# Patient Record
Sex: Female | Born: 1955 | Race: White | Hispanic: Yes | Marital: Married | Smoking: Never smoker
Health system: Southern US, Community
[De-identification: ages and names within clinical notes are randomized; demographics above are authoritative.]

## PROBLEM LIST (undated history)

## (undated) DIAGNOSIS — R059 Cough, unspecified: Secondary | ICD-10-CM

## (undated) DIAGNOSIS — M109 Gout, unspecified: Secondary | ICD-10-CM

## (undated) DIAGNOSIS — D219 Benign neoplasm of connective and other soft tissue, unspecified: Secondary | ICD-10-CM

## (undated) DIAGNOSIS — E119 Type 2 diabetes mellitus without complications: Secondary | ICD-10-CM

## (undated) DIAGNOSIS — G43909 Migraine, unspecified, not intractable, without status migrainosus: Secondary | ICD-10-CM

## (undated) DIAGNOSIS — I10 Essential (primary) hypertension: Secondary | ICD-10-CM

## (undated) DIAGNOSIS — F419 Anxiety disorder, unspecified: Secondary | ICD-10-CM

## (undated) DIAGNOSIS — R05 Cough: Secondary | ICD-10-CM

## (undated) HISTORY — DX: Migraine, unspecified, not intractable, without status migrainosus: G43.909

## (undated) HISTORY — DX: Benign neoplasm of connective and other soft tissue, unspecified: D21.9

## (undated) HISTORY — DX: Cough: R05

## (undated) HISTORY — DX: Gout, unspecified: M10.9

## (undated) HISTORY — DX: Anxiety disorder, unspecified: F41.9

## (undated) HISTORY — DX: Essential (primary) hypertension: I10

## (undated) HISTORY — DX: Cough, unspecified: R05.9

## (undated) HISTORY — PX: ROBOTIC ASSISTED TOTAL HYSTERECTOMY WITH BILATERAL SALPINGO OOPHERECTOMY: SHX6086

---

## 2013-09-27 ENCOUNTER — Emergency Department (HOSPITAL_COMMUNITY): Payer: Medicaid Other

## 2013-09-27 ENCOUNTER — Encounter (HOSPITAL_COMMUNITY): Payer: Self-pay | Admitting: Emergency Medicine

## 2013-09-27 ENCOUNTER — Emergency Department (HOSPITAL_COMMUNITY)
Admission: EM | Admit: 2013-09-27 | Discharge: 2013-09-27 | Disposition: A | Discharge status: Home / Self Care | Payer: Medicaid Other | Attending: Emergency Medicine | Admitting: Emergency Medicine

## 2013-09-27 DIAGNOSIS — J069 Acute upper respiratory infection, unspecified: Secondary | ICD-10-CM | POA: Insufficient documentation

## 2013-09-27 DIAGNOSIS — Z8701 Personal history of pneumonia (recurrent): Secondary | ICD-10-CM | POA: Insufficient documentation

## 2013-09-27 DIAGNOSIS — E119 Type 2 diabetes mellitus without complications: Secondary | ICD-10-CM | POA: Insufficient documentation

## 2013-09-27 DIAGNOSIS — Z79899 Other long term (current) drug therapy: Secondary | ICD-10-CM | POA: Insufficient documentation

## 2013-09-27 HISTORY — DX: Type 2 diabetes mellitus without complications: E11.9

## 2013-09-27 MED ORDER — AMLODIPINE BESYLATE 5 MG PO TABS
5.0000 mg | ORAL_TABLET | Freq: Every day | ORAL | Status: DC
  Administered 2013-09-27: 5 mg via ORAL
  Filled 2013-09-27: qty 1

## 2013-09-27 MED ORDER — DEXTROMETHORPHAN-GUAIFENESIN 10-100 MG/5ML PO LIQD: 10.0000 mL | ORAL | Status: DC | PRN

## 2013-09-27 NOTE — Discharge Instructions (Signed)
Call for a follow up appointment with a Family or Primary Care Provider.  Return if Symptoms worsen.   Take medication as prescribed.  [place] gargles 3-4 times a day.

## 2013-09-27 NOTE — ED Provider Notes (Signed)
Medical screening examination/treatment/procedure(s) were performed by non-physician practitioner and as supervising physician I was immediately available for consultation/collaboration.  Richarda Blade, MD 09/27/13 [phone]

## 2013-09-27 NOTE — ED Notes (Signed)
Pt has had cough for 2 weeks with on/off fever.  Pt went to primary md and was placed on antibiotics.  Pt states symptoms have not improved.  Cough in non-productive.

## 2013-09-27 NOTE — ED Notes (Signed)
Patient transported to X-ray 

## 2013-09-27 NOTE — ED Provider Notes (Signed)
CSN: [id number]     Arrival date & time 09/27/13  0908 History   First MD Initiated Contact with Patient 09/27/13 [phone]     Chief Complaint  Patient presents with  . Cough  . Fever     (Consider location/radiation/quality/duration/timing/severity/associated sxs/prior Treatment) HPI Comments: The patient is a 58 year old female past medical history of diabetes present emergency room chief complaint of persistent cough for 2 weeks. The patient reports being evaluated by medical clinic approximately 2 weeks ago and diagnosed with a viral illness and prescribed Augmentin.  The patient reports starting the antibiotic therapy on 6/4 and was compliant. She was also diagnosed with a urinary tract infection and was prescribed Cipro and reports compliance with that medication. She reports fever approximately one week ago. The patient reports a persistent cough and mild rhinorrhea, itching throat.  Denies recent travel in the [place] or out of the [place], no history of HIV or immunocompromised state. The patient reports a history of hypertension and did not take her Norvasc today.   NO PCO  The history is provided by the patient. No language interpreter was used.    Past Medical History  Diagnosis Date  . Diabetes mellitus without complication    Past Surgical History  Procedure Laterality Date  . Cesarean section     History reviewed. No pertinent family history. History  Substance Use Topics  . Smoking status: Never Smoker   . Smokeless tobacco: Not on file  . Alcohol Use: No   OB History   Grav Para Term Preterm Abortions TAB SAB Ect Mult Living                 Review of Systems  Constitutional: Positive for fever. Negative for chills.  Respiratory: Positive for cough. Negative for shortness of breath and wheezing.   Cardiovascular: Negative for chest pain.  Gastrointestinal: Negative for nausea, vomiting and abdominal pain.  Genitourinary: Negative for dysuria.      Allergies   Review of patient's allergies indicates no known allergies.  Home Medications   Prior to Admission medications   Medication Sig Start Date End Date Taking? Authorizing Fia Hebert  amLODipine (NORVASC) 5 MG tablet Take 5 mg by mouth daily.   Yes Historical Freddye Cardamone, MD  metFORMIN (GLUCOPHAGE) 500 MG tablet Take 500 mg by mouth 2 (two) times daily with a meal.   Yes Historical Coren Sagan, MD  promethazine-phenylephrine (PROMETHAZINE VC) 6.25-5 MG/5ML SYRP Take 5 mLs by mouth every 4 (four) hours as needed for congestion.   Yes Historical Porshe Fleagle, MD   BP 149/91  Pulse 118  Temp(Src) 98.3 F (36.8 C) (Oral)  Resp 16  SpO2 95% Physical Exam  Nursing note and vitals reviewed. Constitutional: She appears well-developed and well-nourished.  Non-toxic appearance. She does not have a sickly appearance. She does not appear ill. No distress.  HENT:  Head: Normocephalic and atraumatic.  Right Ear: Tympanic membrane and external ear normal. No middle ear effusion.  Left Ear: Tympanic membrane and external ear normal.  No middle ear effusion.  Nose: Rhinorrhea present. Right sinus exhibits no maxillary sinus tenderness and no frontal sinus tenderness. Left sinus exhibits no maxillary sinus tenderness and no frontal sinus tenderness.  Mouth/Throat: Uvula is midline and mucous membranes are normal. No oral lesions. No trismus in the jaw. Posterior oropharyngeal erythema present. No oropharyngeal exudate, posterior oropharyngeal edema or tonsillar abscesses.  Eyes: EOM are normal. Pupils are equal, round, and reactive to light. Right eye exhibits no discharge. Left  eye exhibits no discharge.  Neck: Normal range of motion. Neck supple.  Cardiovascular: Normal rate and regular rhythm.   No murmur heard. Pulmonary/Chest: Effort normal and breath sounds normal. She has no wheezes. She has no rales.  Abdominal: Soft.  Lymphadenopathy:    She has no cervical adenopathy.  Skin: Skin is warm and dry. No rash  noted. She is not diaphoretic.  Psychiatric: She has a normal mood and affect. Her behavior is normal. Thought content normal.    ED Course  Procedures (including critical care time) Labs Review Labs Reviewed - No data to display  Imaging Review Dg Chest 2 View  09/27/2013   CLINICAL DATA:  Cough, congestion  EXAM: CHEST  2 VIEW  COMPARISON:  None.  FINDINGS: Cardiomediastinal silhouette is unremarkable. No pulmonary edema. There is bilateral streaky atelectasis or early infiltrate. Mild degenerative changes thoracic spine.  IMPRESSION: No pulmonary edema. Bilateral basilar streaky atelectasis or early infiltrate. Mild degenerative changes thoracic spine.   Electronically Signed   By: Lahoma Crocker M.D.   On: 09/27/2013 09:48     EKG Interpretation None      MDM   Final diagnoses:  URI (upper respiratory infection)  History of pneumonia   Patient presents with persistent cough, treated for 7 days with Augmentin and Cipro, reports compliance x-ray shows bilateral basilar streaky atelectasis or early infiltrate. Lungs clear to auscultation, afebrile. X-ray likely persistent changes do to recent pneumonia. The patient is tachycardic at approximately 118, noncompliant and Norvasc this morning while given a dose of Norvasc here and reevaluate. Discharge pulse 73. Discussed lab results, imaging results, and treatment plan with the patient. Advised patient to followup with her PCP for a repeat x-ray in several weeks. Will treat symptomatically for a likely viral illness. Return precautions given. Reports understanding and no other concerns at this time.  Patient is stable for discharge at this time.  Meds given in ED:  Medications - No data to display  Discharge Medication List as of 09/27/2013 11:12 AM    START taking these medications   Details  dextromethorphan-guaiFENesin (ROBITUSSIN-DM) 10-100 MG/5ML liquid Take 10 mLs by mouth every 4 (four) hours as needed for cough., Starting  09/27/2013, Until Discontinued, Print           Lorrine Kin, PA-C 09/27/13 1558

## 2013-10-18 ENCOUNTER — Ambulatory Visit (INDEPENDENT_AMBULATORY_CARE_PROVIDER_SITE_OTHER): Payer: Medicaid Other | Admitting: Pulmonary Disease

## 2013-10-18 ENCOUNTER — Encounter: Payer: Self-pay | Admitting: Pulmonary Disease

## 2013-10-18 ENCOUNTER — Ambulatory Visit (INDEPENDENT_AMBULATORY_CARE_PROVIDER_SITE_OTHER)
Admission: RE | Admit: 2013-10-18 | Discharge: 2013-10-18 | Disposition: A | Discharge status: Home / Self Care | Payer: Medicaid Other | Source: Ambulatory Visit | Attending: Pulmonary Disease | Admitting: Pulmonary Disease

## 2013-10-18 VITALS — BP 130/80 | HR 199 | Temp 98.2°F | Ht 64.0 in | Wt 174.6 lb

## 2013-10-18 DIAGNOSIS — R05 Cough: Secondary | ICD-10-CM

## 2013-10-18 DIAGNOSIS — R059 Cough, unspecified: Secondary | ICD-10-CM

## 2013-10-18 DIAGNOSIS — R053 Chronic cough: Secondary | ICD-10-CM

## 2013-10-18 MED ORDER — PREDNISONE 10 MG PO TABS: ORAL_TABLET | ORAL | Status: DC

## 2013-10-18 MED ORDER — TRAMADOL HCL 50 MG PO TABS: 50.0000 mg | ORAL_TABLET | Freq: Four times a day (QID) | ORAL | Status: DC | PRN

## 2013-10-18 NOTE — Progress Notes (Signed)
   Subjective:    Patient ID: Laura Richardson, female    DOB: 03-27-1956, 58 y.o.   MRN: [id number]  HPI The pt is a 58 y/o hispanic female who I have been asked to see for chronic cough.  She speaks very little/, and therefore history is obtained through an interpreter. She was in her usual state of health with no pulmonary issues and told approximately 4 weeks ago. She developed a fever, chest congestion, as well as a cough with white mucus. She also had some nasal congestion and was producing purulent mucus from her nose. She was treated with Augmentin, as well as Cipro for a possible urinary tract infection, and a chest x-ray at that time showed basilar atelectasis versus early infiltrate. The patient had definite improvement in her cough, but it has not resolved. It is now dry and hacking in nature, and clearly has a cyclical component. It is definitely better when she takes her cough syrup at night. She has not had any significant change in her breathing during the entire episode. She denies any postnasal drip or reflux symptoms, and does not clear her throat excessively. She does feel an "itch" in her throat/upper airway which only resolved with a cough. She denies any further fevers or chills. She has not had a followup chest x-ray.   Review of Systems  Constitutional: Negative for fever and unexpected weight change.  HENT: Negative for congestion, dental problem, ear pain, nosebleeds, postnasal drip, rhinorrhea, sinus pressure, sneezing, sore throat and trouble swallowing.   Eyes: Negative for redness and itching.  Respiratory: Positive for cough and shortness of breath. Negative for chest tightness and wheezing.   Cardiovascular: Negative for palpitations and leg swelling.  Gastrointestinal: Negative for nausea and vomiting.  Genitourinary: Negative for dysuria.  Musculoskeletal: Negative for joint swelling.  Skin: Negative for rash.  Neurological: Negative for headaches.    Hematological: Does not bruise/bleed easily.  Psychiatric/Behavioral: Negative for dysphoric mood. The patient is not nervous/anxious.        Objective:   Physical Exam Constitutional:  Well developed, no acute distress  HENT:  Nares patent without discharge  Oropharynx without exudate, palate and uvula are normal  Eyes:  Perrla, eomi, no scleral icterus  Neck:  No JVD, no TMG  Cardiovascular:  Normal rate, regular rhythm, no rubs or gallops.  No murmurs        Intact distal pulses  Pulmonary :  Normal breath sounds, no stridor or respiratory distress   No rales, rhonchi, or wheezing  Abdominal:  Soft, nondistended, bowel sounds present.  No tenderness noted.   Musculoskeletal:  No lower extremity edema noted.  Lymph Nodes:  No cervical lymphadenopathy noted  Skin:  No cyanosis noted  Neurologic:  Alert, appropriate, moves all 4 extremities without obvious deficit.         Assessment & Plan:

## 2013-10-18 NOTE — Patient Instructions (Signed)
Will check chest xray today, and call you with results. Use voice as little as possible, and no throat clearing. Try hard candy (no mint or cough drops) during the day to help limit cough Prednisone over 8 days to treat inflammation in your breathing passages. Can use tramadol 50mg  one every 6 hrs if needed for cough.  Can make you sleepy. Can take your cough syrup at night if needed for cough.  Would like to try this for 2-3 weeks and see if helps.  followup with me again in 3 weeks.

## 2013-10-18 NOTE — Assessment & Plan Note (Signed)
The patient has a persistent cough after what sounds like an episode of severe bronchitis or possibly early pneumonia by chest x-ray. She had no history of prior lung issues. She has been treated with antibiotics with significant improvement in her cough, but is left with a residual that has a cyclical component to it. At this point, I would like to work on cough suppression, as well as behavioral therapies to limit irritation to the back of the throat. I will also treat her with a short course of prednisone to help with airway inflammation in the event of post infectious bronchiolitis. Will also check a chest x-ray today for followup.

## 2013-11-09 ENCOUNTER — Encounter: Payer: Self-pay | Admitting: Pulmonary Disease

## 2013-11-09 ENCOUNTER — Encounter (INDEPENDENT_AMBULATORY_CARE_PROVIDER_SITE_OTHER): Payer: Self-pay

## 2013-11-09 ENCOUNTER — Ambulatory Visit (INDEPENDENT_AMBULATORY_CARE_PROVIDER_SITE_OTHER): Payer: Medicaid Other | Admitting: Pulmonary Disease

## 2013-11-09 VITALS — BP 110/78 | HR 108 | Temp 98.1°F | Ht 64.0 in | Wt 167.6 lb

## 2013-11-09 DIAGNOSIS — R059 Cough, unspecified: Secondary | ICD-10-CM

## 2013-11-09 DIAGNOSIS — R053 Chronic cough: Secondary | ICD-10-CM

## 2013-11-09 DIAGNOSIS — R05 Cough: Secondary | ICD-10-CM

## 2013-11-09 NOTE — Assessment & Plan Note (Signed)
The patient's cough is 40% improved from the last visit with behavioral therapies and as needed tramadol. She is still feeling a globus sensation, and is complaining of postnasal drip today. I would like to add chlorpheniramine at bedtime, and get her to continue with behavioral therapies and as needed tramadol. I am hoping this is going to resolve on its own over time. Her spirometry today was totally normal, and her interstitial changes on x-ray are minimal with no crackles on exam. I really do not think her cough is from a lung issue.

## 2013-11-09 NOTE — Progress Notes (Signed)
   Subjective:    Patient ID: Laura Richardson, female    DOB: October 17, 1955, 58 y.o.   MRN: [id number]  HPI The patient comes in today for followup of her chronic cough that is felt to be upper airway in origin. She was treated with aggressive cough suppression, as well as behavioral therapies to minimize irritation to the upper airway. The patient has seen a 40% improvement in her cough from the last visit, and I have told her this is a good response in that short period of time. Her cough is now more intermittent rather than consistent, and she is now having a feeling of tickle in her throat along with postnasal drip. Her chest x-ray showed no acute process, but did show minimal interstitial change in the bases but without crackles on exam. She was not able to do spirometry at the last visit because of her cyclical coughing.   Review of Systems  Constitutional: Negative for fever and unexpected weight change.  HENT: Negative for congestion, dental problem, ear pain, nosebleeds, postnasal drip, rhinorrhea, sinus pressure, sneezing, sore throat and trouble swallowing.   Eyes: Negative for redness and itching.  Respiratory: Positive for cough. Negative for chest tightness, shortness of breath and wheezing.   Cardiovascular: Negative for palpitations and leg swelling.  Gastrointestinal: Negative for nausea and vomiting.  Genitourinary: Negative for dysuria.  Musculoskeletal: Negative for joint swelling.  Skin: Negative for rash.  Neurological: Negative for headaches.  Hematological: Does not bruise/bleed easily.  Psychiatric/Behavioral: Negative for dysphoric mood. The patient is not nervous/anxious.        Objective:   Physical Exam Well-developed female in no acute distress Nose without purulence or discharge noted Neck without lymphadenopathy or thyromegaly Chest totally clear to auscultation, no wheezing Cardiac exam with regular rate and rhythm Lower extremities without edema, no  cyanosis Alert and oriented, moves all 4 extremities.       Assessment & Plan:

## 2013-11-09 NOTE — Patient Instructions (Signed)
Continue on behavioral therapies such as no throat clearing, hard candy to prevent tickle, and tramadol as needed. Take chlorpheniramine 4mg  OTC, 2 at bedtime each night for next 2 weeks to help with postnasal drip/tickle in throat. Would like to give this more time to improve.  Please call me in 4 weeks to give update on how things are going.  Remember to rate your cough on scale of 0-10 with 10 being the worst it has been and 0 being no cough.

## 2013-12-09 ENCOUNTER — Telehealth: Payer: Self-pay | Admitting: Pulmonary Disease

## 2013-12-09 NOTE — Telephone Encounter (Signed)
lmtcb x1 

## 2013-12-13 NOTE — Telephone Encounter (Signed)
Last time she was here, she told me the cough was a 6/10 with 10 being the worst it has ever been and 0 being no cough.  Where does she think she is now.

## 2013-12-13 NOTE — Telephone Encounter (Signed)
Called and spoke to Mr. [place]. Pt states her cough has improved but has not completely resolved and is c/o sore throat. Pt is requesting for any recs that Dr. Gwenette Greet may have. Pt denies any SOB, CP, PND, swelling, f/c/s. Pt is not taking tramadol any more. Pt last seen on 11/09/2013 by [facility].   [place] please advise.   No Known Allergies

## 2013-12-14 NOTE — Telephone Encounter (Signed)
Would like to try something conservative such as chloroseptic lozenges to help with sore throat.  If cough is down to a 3, would like to give this a little more time to totally resolve.  Would like to see pt back in 2-3 weeks to re-evaluate cough.

## 2013-12-14 NOTE — Telephone Encounter (Signed)
Per patient husband cough is a 3/10--not completely resolved Husband states that his main concern is that her throat is sore--cough seems to be improving  Denies use of OTC meds for cough. Would like any rec's to help with sore throat and what little cough is left.  Please advise thanks.

## 2013-12-14 NOTE — Telephone Encounter (Signed)
Called spoke with Kizzie Furnish and discussed KC's recommendations as stated below.  Kizzie Furnish verbalized his understanding and denied any further questions/concerns.  Appt scheduled with Henrietta on 10.5.15 @ 0945 to re-evaluate pt's cough.  Nothing further needed at this time; will sign off.

## 2014-01-09 ENCOUNTER — Ambulatory Visit: Payer: Medicaid Other | Admitting: Pulmonary Disease

## 2015-05-24 IMAGING — CR DG CHEST 2V
2 series · 2 of 2 positions shown · non-contrast
Comparison: September 27, 2013.

CLINICAL DATA: Cough.

EXAM:
CHEST  2 VIEW

[view not recorded (1 of 2)]
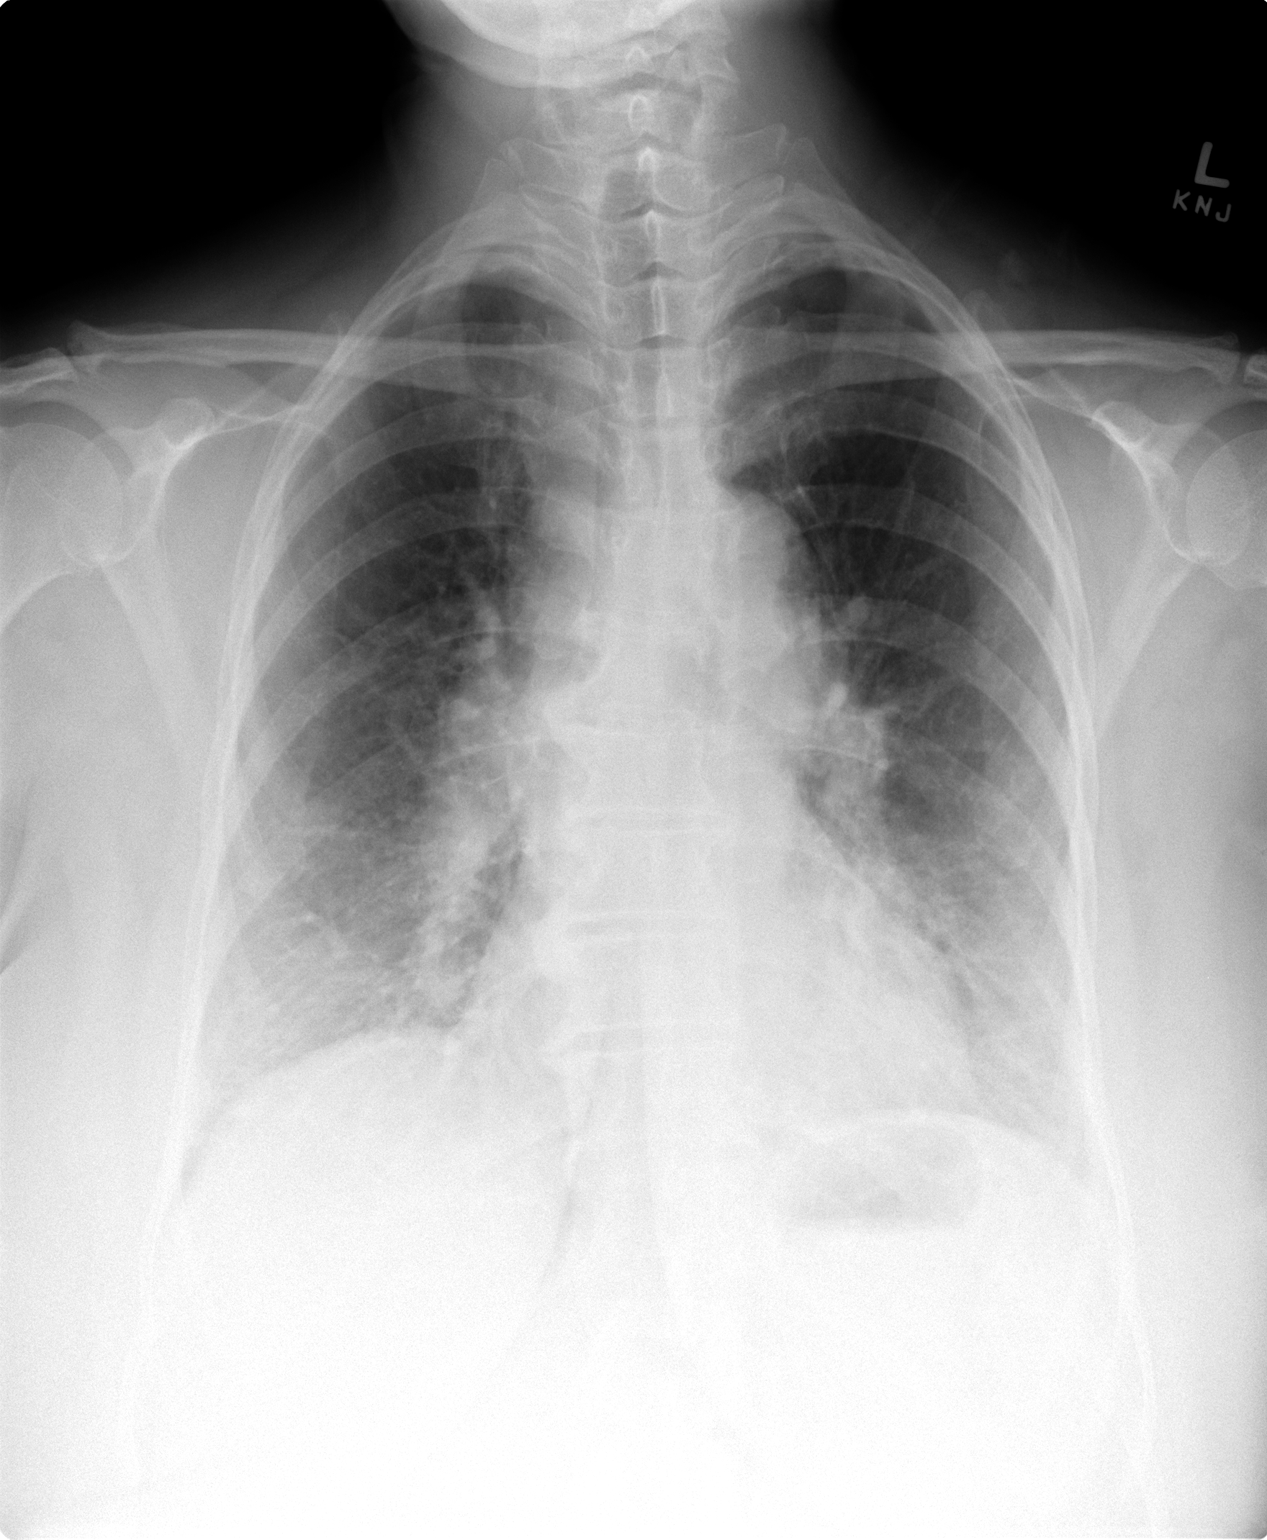

[view not recorded (2 of 2)]
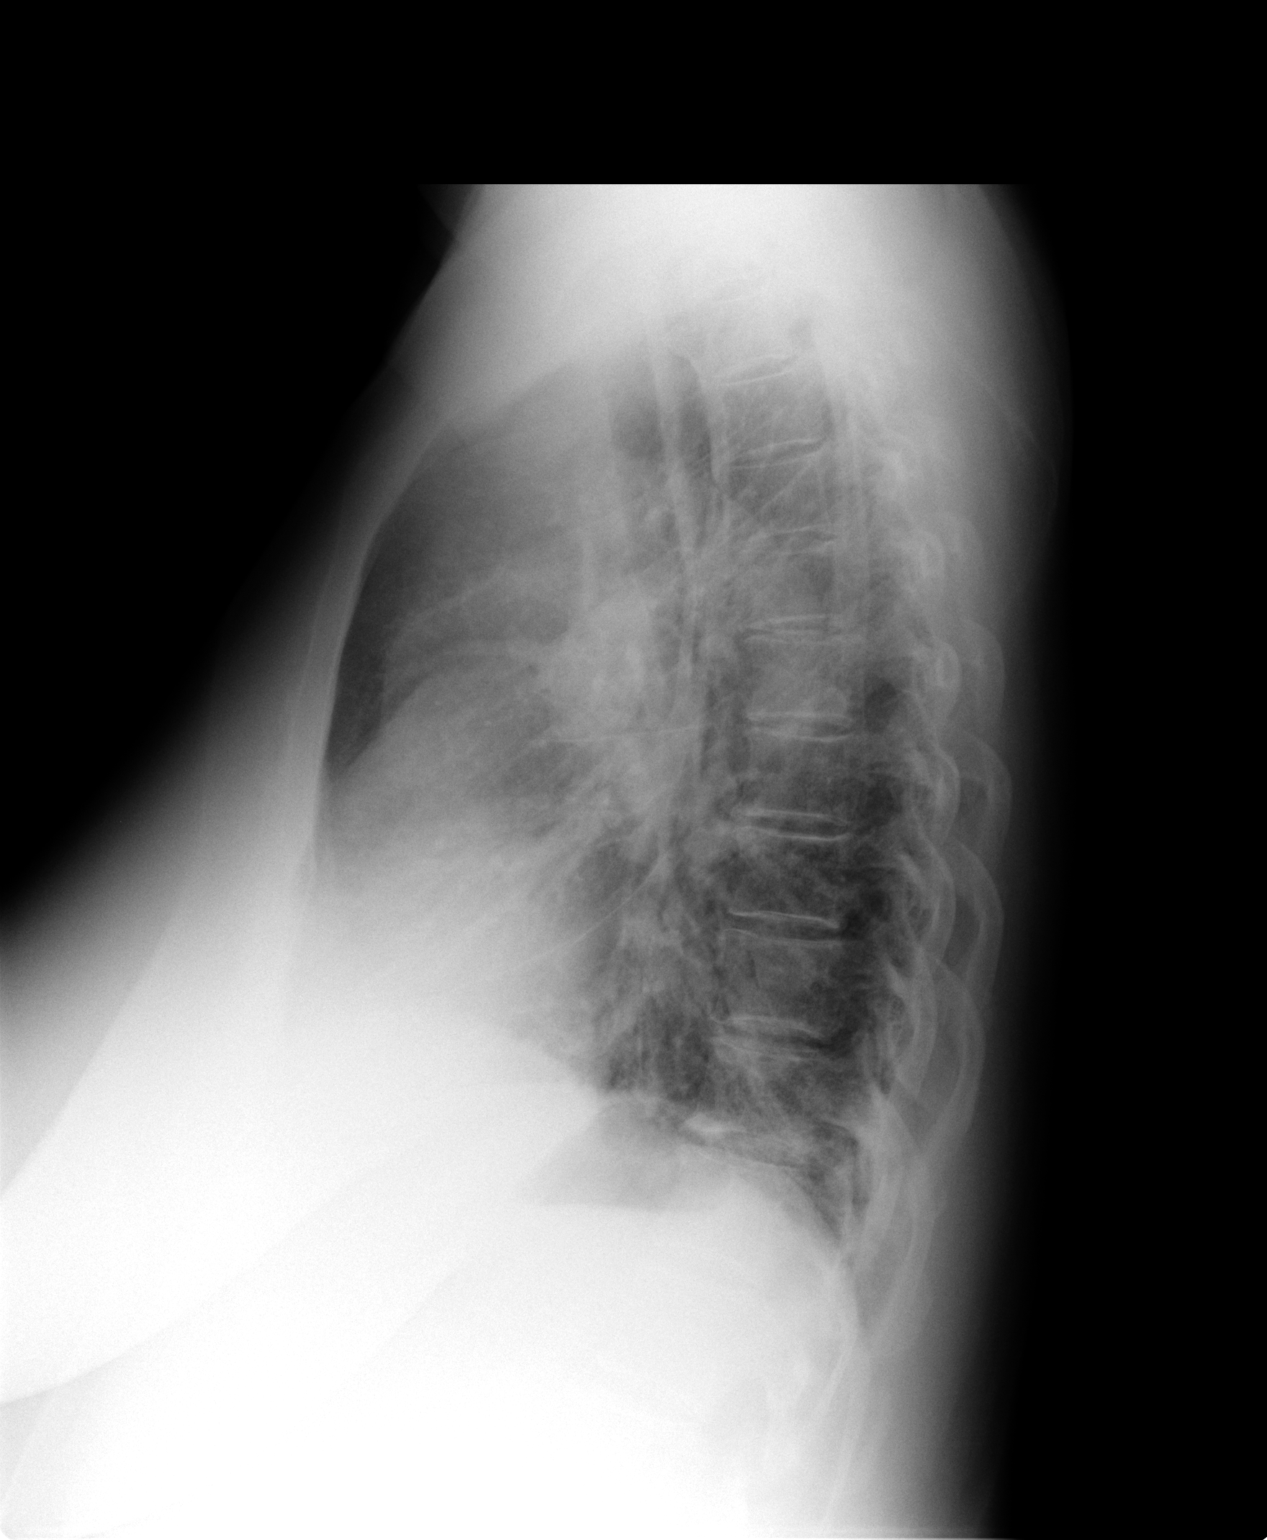

[2 of 2 positions shown; findings below may reference images not displayed]

FINDINGS: Stable cardiomediastinal silhouette. Stable mild bilateral basilar
interstitial densities are noted which may represent scarring, but
superimposed edema or inflammation cannot be excluded. No
pneumothorax or pleural effusion is noted. Osteophyte formation is
seen involving the lower thoracic spine.
IMPRESSION: Stable mild bilateral basilar interstitial densities which most
likely represent scarring, but superimposed edema or inflammation
cannot be excluded.

## 2015-06-07 ENCOUNTER — Encounter (HOSPITAL_COMMUNITY): Payer: Self-pay | Admitting: Emergency Medicine

## 2015-06-07 ENCOUNTER — Emergency Department (HOSPITAL_COMMUNITY): Payer: Medicaid Other

## 2015-06-07 ENCOUNTER — Emergency Department (HOSPITAL_COMMUNITY)
Admission: EM | Admit: 2015-06-07 | Discharge: 2015-06-07 | Disposition: A | Discharge status: Home / Self Care | Payer: Medicaid Other | Attending: Emergency Medicine | Admitting: Emergency Medicine

## 2015-06-07 DIAGNOSIS — M25512 Pain in left shoulder: Secondary | ICD-10-CM | POA: Insufficient documentation

## 2015-06-07 DIAGNOSIS — Z7984 Long term (current) use of oral hypoglycemic drugs: Secondary | ICD-10-CM | POA: Insufficient documentation

## 2015-06-07 DIAGNOSIS — M549 Dorsalgia, unspecified: Secondary | ICD-10-CM | POA: Insufficient documentation

## 2015-06-07 DIAGNOSIS — M25511 Pain in right shoulder: Secondary | ICD-10-CM | POA: Insufficient documentation

## 2015-06-07 DIAGNOSIS — I10 Essential (primary) hypertension: Secondary | ICD-10-CM | POA: Diagnosis not present

## 2015-06-07 DIAGNOSIS — Z79899 Other long term (current) drug therapy: Secondary | ICD-10-CM | POA: Diagnosis not present

## 2015-06-07 DIAGNOSIS — E119 Type 2 diabetes mellitus without complications: Secondary | ICD-10-CM | POA: Diagnosis not present

## 2015-06-07 DIAGNOSIS — R0789 Other chest pain: Secondary | ICD-10-CM | POA: Diagnosis not present

## 2015-06-07 DIAGNOSIS — R079 Chest pain, unspecified: Secondary | ICD-10-CM | POA: Diagnosis present

## 2015-06-07 DIAGNOSIS — M542 Cervicalgia: Secondary | ICD-10-CM | POA: Insufficient documentation

## 2015-06-07 DIAGNOSIS — Z791 Long term (current) use of non-steroidal anti-inflammatories (NSAID): Secondary | ICD-10-CM | POA: Diagnosis not present

## 2015-06-07 LAB — CBC
HCT: 49 % — ABNORMAL HIGH (ref 36.0–46.0)
Hemoglobin: 15.5 g/dL — ABNORMAL HIGH (ref 12.0–15.0)
MCH: 25.6 pg — ABNORMAL LOW (ref 26.0–34.0)
MCHC: 31.6 g/dL (ref 30.0–36.0)
MCV: 81 fL (ref 78.0–100.0)
Platelets: 253 K/uL (ref 150–400)
RBC: 6.05 MIL/uL — ABNORMAL HIGH (ref 3.87–5.11)
RDW: 14.9 % (ref 11.5–15.5)
WBC: 10.1 K/uL (ref 4.0–10.5)

## 2015-06-07 LAB — BASIC METABOLIC PANEL WITH GFR
Anion gap: 10 (ref 5–15)
BUN: 18 mg/dL (ref 6–20)
CO2: 25 mmol/L (ref 22–32)
Calcium: 9.5 mg/dL (ref 8.9–10.3)
Chloride: 103 mmol/L (ref 101–111)
Creatinine, Ser: 0.69 mg/dL (ref 0.44–1.00)
GFR calc Af Amer: >60 mL/min
GFR calc non Af Amer: >60 mL/min
Glucose, Bld: 101 mg/dL — ABNORMAL HIGH (ref 65–99)
Potassium: 3.7 mmol/L (ref 3.5–5.1)
Sodium: 138 mmol/L (ref 135–145)

## 2015-06-07 LAB — I-STAT TROPONIN, ED: Troponin i, poc: 0 ng/mL (ref 0.00–0.08)

## 2015-06-07 MED ORDER — CYCLOBENZAPRINE HCL 10 MG PO TABS: 10.0000 mg | ORAL_TABLET | Freq: Two times a day (BID) | ORAL | Status: DC | PRN

## 2015-06-07 NOTE — ED Notes (Addendum)
Pt c/o chest, arm, back. No SOB. Pt reports having seen provider for the same recently, was told pain was musculoskeletal. Pain worsens with movement of arms but present at rest. Not reproducible with palpation.

## 2015-06-07 NOTE — Discharge Instructions (Signed)
Dolor en la pared torcica (Chest Wall Pain) El dolor en la pared torcica se produce en los huesos y los msculos del pecho o alrededor de Orthoptist. A veces, una lesin Arts administrator. En ocasiones, la causa puede ser desconocida. Este dolor puede durar varias semanas. INSTRUCCIONES PARA EL CUIDADO EN EL HOGAR  Est atento a cualquier cambio en los sntomas. Tome estas medidas para Theatre stage manager dolor:   Haga reposo como se lo haya indicado el [place] actividades que causan dolor. Estas pueden ser [employer] requieren el uso de los msculos del trax, los abdominales o los laterales para levantar objetos pesados.   Si se lo indican, aplique hielo sobre la zona dolorida:  Ponga el hielo en una bolsa plstica.  Coloque una toalla entre la piel y la bolsa de hielo.  Coloque el hielo durante 53minutos, 2 a 3veces por Training and development officer.  Tome los medicamentos de venta libre y los recetados solamente como se lo haya indicado el mdico.  No consuma productos que contengan tabaco, incluidos cigarrillos, tabaco de Higher education careers adviser y Psychologist, sport and exercise. Si necesita ayuda para dejar de fumar, consulte al mdico.  Concurra a todas las visitas de control como se lo haya indicado el mdico. Esto es importante. SOLICITE ATENCIN MDICA SI:  Jaclynn Guarneri.  El dolor de [place].  Aparecen nuevos sntomas. SOLICITE ATENCIN MDICA DE INMEDIATO SI:  Tiene nuseas o vmitos.  Philbert Riser o tiene sensacin de desvanecimiento.  Tiene tos con flema (esputo) o expectora sangre al toser.  Le falta el aire.   Esta informacin no tiene Marine scientist el consejo del mdico. Asegrese de hacerle al mdico cualquier pregunta que tenga.  Follow up with primary care provider for re-evaluation of symptoms. Apply ice to affected area. Encourage stretching of muscles. Take Flexeril and needed for muscle spasms. Return to the ED if you experience severe worsening of your symptoms, dizziness, weakness,  shortness of breath, fever, blurry vision, numbness/tingling in your extremities.

## 2015-06-08 NOTE — ED Provider Notes (Signed)
CSN: [id number]     Arrival date & time 06/07/15  1006 History   First MD Initiated Contact with Patient 06/07/15 1206     Chief Complaint  Patient presents with  . Chest Pain     (Consider location/radiation/quality/duration/timing/severity/associated sxs/prior Treatment) HPI   Laura Richardson is a 60 y.o F with a pmhx of HTN, DM who presents to the ED today c/o chest pain. Pt states that 1 week pt has been experiencing intermittent pain across her chest, bilateral shoulders, neck and back. Pain is described as crampy, and "feels like a sore muscle". No known trauma or injury. Pain is not reproducible with palpation. No aggravating or alleviating factors. Pain comes and goes several times a day. No associated diaphoresis, dizziness, paresthesias, shortness of breath. Patient was seen by primary care doctor earlier this week for same symptoms and was told pain was musculoskeletal. She was given baclofen and tramadol for her symptoms. Patient has not been taking them as frequently because she states that they make her sick to her stomach.  Past Medical History  Diagnosis Date  . Diabetes mellitus without complication ([place])   . Cough   . Gout   . HTN (hypertension)    Past Surgical History  Procedure Laterality Date  . Cesarean section     History reviewed. No pertinent family history. Social History  Substance Use Topics  . Smoking status: Never Smoker   . Smokeless tobacco: None  . Alcohol Use: No   OB History    No data available     Review of Systems  All other systems reviewed and are negative.     Allergies  Review of patient's allergies indicates no known allergies.  Home Medications   Prior to Admission medications   Medication Sig Start Date End Date Taking? Authorizing Provider  amLODipine (NORVASC) 5 MG tablet Take 5 mg by mouth daily.   Yes Historical Provider, MD  diclofenac (VOLTAREN) 75 MG EC tablet Take 75 mg by mouth 2 (two) times daily. 06/01/15  Yes  Historical Provider, MD  metFORMIN (GLUCOPHAGE) 500 MG tablet Take 500 mg by mouth daily after supper.   Yes Historical Provider, MD  traMADol (ULTRAM) 50 MG tablet Take 50 mg by mouth 2 (two) times daily as needed (for pain).   Yes Historical Provider, MD  cyclobenzaprine (FLEXERIL) 10 MG tablet Take 1 tablet (10 mg total) by mouth 2 (two) times daily as needed for muscle spasms. 06/07/15   Connee Ikner Tripp Kelin Borum, PA-C   BP 134/78 mmHg  Pulse 71  Temp(Src) 97.6 F (36.4 C) (Oral)  Resp 18  SpO2 97% Physical Exam  Constitutional: She is oriented to person, place, and time. She appears well-developed and well-nourished. No distress.  HENT:  Head: Normocephalic and atraumatic.  Mouth/Throat: No oropharyngeal exudate.  Eyes: Conjunctivae and EOM are normal. Pupils are equal, round, and reactive to light. Right eye exhibits no discharge. Left eye exhibits no discharge. No scleral icterus.  Neck: Neck supple.  No bruits.  Cardiovascular: Normal rate, regular rhythm, normal heart sounds and intact distal pulses.  Exam reveals no gallop and no friction rub.   No murmur heard. Radial pulses equal bilaterally.  Pulmonary/Chest: Effort normal and breath sounds normal. No respiratory distress. She has no wheezes. She has no rales. She exhibits no tenderness.  Abdominal: Soft. She exhibits no distension. There is no tenderness. There is no guarding.  Musculoskeletal: Normal range of motion. She exhibits no edema.  Lymphadenopathy:  She has no cervical adenopathy.  Neurological: She is alert and oriented to person, place, and time. She has normal reflexes. No cranial nerve deficit. She exhibits normal muscle tone. Coordination normal.  Strength 5/5 throughout. No sensory deficits.  No gait abnormality. Normal finger to nose.   Skin: Skin is warm and dry. No rash noted. She is not diaphoretic. No erythema. No pallor.  Psychiatric: She has a normal mood and affect. Her behavior is normal.  Nursing  note and vitals reviewed.   ED Course  Procedures (including critical care time) Labs Review Labs Reviewed  BASIC METABOLIC PANEL - Abnormal; Notable for the following:    Glucose, Bld 101 (*)    All other components within normal limits  CBC - Abnormal; Notable for the following:    RBC 6.05 (*)    Hemoglobin 15.5 (*)    HCT 49.0 (*)    MCH 25.6 (*)    All other components within normal limits  Randolm Idol, ED    Imaging Review Dg Chest 2 View  06/07/2015  CLINICAL DATA:  Chest pain. EXAM: CHEST  2 VIEW COMPARISON:  October 18, 2013. FINDINGS: The heart size and mediastinal contours are within normal limits. No pneumothorax or pleural effusion is noted. Mild bibasilar subsegmental atelectasis or scarring is noted. The visualized skeletal structures are unremarkable. IMPRESSION: Mild bibasilar subsegmental atelectasis or scarring. Electronically Signed   By: Marijo Conception, M.D.   On: 06/07/2015 13:30   I have personally reviewed and evaluated these images and lab results as part of my medical decision-making.   EKG Interpretation   Date/Time:  Thursday June 07 2015 13:44:19 EST Ventricular Rate:  89 PR Interval:  131 QRS Duration: 79 QT Interval:  394 QTC Calculation: 479 R Axis:   -41 Text Interpretation:  Sinus rhythm Left anterior fascicular block Probable  anterior infarct, old Baseline wander in lead(s) V2 V6 ED PHYSICIAN  INTERPRETATION AVAILABLE IN [facility] Confirmed by TEST, Record  ([id number]) on 06/08/2015 7:22:51 AM      MDM   Final diagnoses:  Chest wall pain    Patient is to be discharged with recommendation to follow up with PCP in regards to today's hospital visit. Chest pain is not likely of cardiac or pulmonary etiology d/t presentation, perc negative, VSS, no tracheal deviation, no JVD or new murmur, RRR, breath sounds equal bilaterally, EKG without acute abnormalities, negative troponin, and negative CXR. Pt is low risk HEART score. Low  suspicion ACS. No neurological deficits. Doubt dissection. Pain is likely due to musculoskeletal etiology. Will d/c home with flexeril. Pt instructed to return to the ED is CP becomes exertional, associated with diaphoresis or nausea, radiates to left jaw/arm, worsens or becomes concerning in any way. Pt appears reliable for follow up and is agreeable to discharge.   Case has been discussed with and seen by Dr. Wilson Singer who agrees with the above plan to discharge.      Dondra Spry [place], PA-C 06/08/15 [place], MD 06/10/15 [phone]

## 2015-12-25 ENCOUNTER — Ambulatory Visit (INDEPENDENT_AMBULATORY_CARE_PROVIDER_SITE_OTHER): Payer: No Typology Code available for payment source | Admitting: Family Medicine

## 2015-12-25 ENCOUNTER — Encounter: Payer: Self-pay | Admitting: Family Medicine

## 2015-12-25 VITALS — BP 135/75 | HR 89 | Temp 98.0°F | Resp 18 | Ht 64.0 in | Wt 176.0 lb

## 2015-12-25 DIAGNOSIS — Z114 Encounter for screening for human immunodeficiency virus [HIV]: Secondary | ICD-10-CM

## 2015-12-25 DIAGNOSIS — Z1211 Encounter for screening for malignant neoplasm of colon: Secondary | ICD-10-CM

## 2015-12-25 DIAGNOSIS — Z1159 Encounter for screening for other viral diseases: Secondary | ICD-10-CM

## 2015-12-25 DIAGNOSIS — E138 Other specified diabetes mellitus with unspecified complications: Secondary | ICD-10-CM

## 2015-12-25 LAB — LIPID PANEL
Cholesterol: 154 mg/dL (ref 125–200)
HDL: 50 mg/dL (ref >=46)
LDL CALC: 61 mg/dL (ref <130)
Total CHOL/HDL Ratio: 3.1 Ratio (ref <=5.0)
Triglycerides: 213 mg/dL — ABNORMAL HIGH (ref <150)
VLDL: 43 mg/dL — ABNORMAL HIGH (ref <30)

## 2015-12-25 LAB — CBC WITH DIFFERENTIAL/PLATELET
BASOS PCT: 0 %
Basophils Absolute: 0 cells/uL (ref 0–200)
Eosinophils Absolute: 783 cells/uL — ABNORMAL HIGH (ref 15–500)
Eosinophils Relative: 9 %
HEMATOCRIT: 45.6 % — AB (ref 35.0–45.0)
Hemoglobin: 14.8 g/dL (ref 11.7–15.5)
LYMPHS PCT: 38 %
Lymphs Abs: 3306 cells/uL (ref 850–3900)
MCH: 26.1 pg — ABNORMAL LOW (ref 27.0–33.0)
MCHC: 32.5 g/dL (ref 32.0–36.0)
MCV: 80.4 fL (ref 80.0–100.0)
MONO ABS: 348 {cells}/uL (ref 200–950)
MPV: 10.3 fL (ref 7.5–12.5)
Monocytes Relative: 4 %
Neutro Abs: 4263 cells/uL (ref 1500–7800)
Neutrophils Relative %: 49 %
Platelets: 243 10*3/uL (ref 140–400)
RBC: 5.67 MIL/uL — ABNORMAL HIGH (ref 3.80–5.10)
RDW: 14.4 % (ref 11.0–15.0)
WBC: 8.7 10*3/uL (ref 3.8–10.8)

## 2015-12-25 LAB — COMPLETE METABOLIC PANEL WITH GFR
ALT: 24 U/L (ref 6–29)
AST: 23 U/L (ref 10–35)
Albumin: 4.5 g/dL (ref 3.6–5.1)
Alkaline Phosphatase: 101 U/L (ref 33–130)
BUN: 14 mg/dL (ref 7–25)
CO2: 21 mmol/L (ref 20–31)
Calcium: 9.4 mg/dL (ref 8.6–10.4)
Chloride: 104 mmol/L (ref 98–110)
Creat: 0.65 mg/dL (ref 0.50–0.99)
GFR, Est Non African American: >89 mL/min (ref >=60)
Glucose, Bld: 107 mg/dL — ABNORMAL HIGH (ref 65–99)
Potassium: 3.9 mmol/L (ref 3.5–5.3)
Sodium: 139 mmol/L (ref 135–146)
Total Bilirubin: 0.5 mg/dL (ref 0.2–1.2)
Total Protein: 7.6 g/dL (ref 6.1–8.1)

## 2015-12-25 LAB — GLUCOSE, CAPILLARY: Glucose-Capillary: 121 mg/dL — ABNORMAL HIGH (ref 65–99)

## 2015-12-25 MED ORDER — LISINOPRIL 20 MG PO TABS: 20.0000 mg | ORAL_TABLET | Freq: Every day | ORAL | 1 refills | Status: DC

## 2015-12-25 MED ORDER — METFORMIN HCL 500 MG PO TABS: 500.0000 mg | ORAL_TABLET | Freq: Every day | ORAL | 1 refills | Status: DC

## 2015-12-25 MED ORDER — SIMVASTATIN 20 MG PO TABS: 20.0000 mg | ORAL_TABLET | Freq: Every day | ORAL | 1 refills | Status: DC

## 2015-12-25 MED ORDER — NYSTATIN-TRIAMCINOLONE 100000-0.1 UNIT/GM-% EX OINT: 1.0000 "application " | TOPICAL_OINTMENT | Freq: Two times a day (BID) | CUTANEOUS | 0 refills | Status: DC

## 2015-12-25 MED FILL — metFORMIN HCL 500 MG TABS: 500 | 30 days supply | Qty: 30 | Fill #0 | Status: TO

## 2015-12-25 MED FILL — SIMVASTATIN 20 MG TABLET: 20 | 30 days supply | Qty: 30 | Fill #0

## 2015-12-25 MED FILL — NYSTATIN-TRIAMCINOLONE OINT: 100000-0.1 | 30 days supply | Qty: 30 | Fill #0

## 2015-12-25 MED FILL — ?LISINOPRIL 20 MG TABLET: 20 | 30 days supply | Qty: 30 | Fill #0

## 2015-12-25 NOTE — Progress Notes (Signed)
Laura Richardson, is a 60 y.o. female  [id number]  [id number]  DOB - [birth year]  CC:  Chief Complaint  Patient presents with  . Follow-up       HPI: Laura Richardson is a 60 y.o. female here to establish care. She is accompanied by her daughter and an interpreter. She has a history of diabetes. She is on metformin 500 mg once a day with dinner. Her A1C today is 6.3. She is no on a statin or ACE/ARB She also has a history of gout and muscle spasms. She reports a chronic cough that comes from high in the throat, which has been present for several years. She is not aware of what may make it better or worse. She reports intermittent itching in genital area and request refill for nystatin/triamcinolone.  Health maintenance: Her PAP and mammogram are up to date. She is in need of a diabetic eye exam. She declines Tdap, flu and pneumonia vaccines. She does need colon cancer screening, screening for hep C and HIV.  No Known Allergies Past Medical History:  Diagnosis Date  . Cough   . Diabetes mellitus without complication ([place])   . Gout   . HTN (hypertension)    Current Outpatient Prescriptions on File Prior to Visit  Medication Sig Dispense Refill  . cyclobenzaprine (FLEXERIL) 10 MG tablet Take 1 tablet (10 mg total) by mouth 2 (two) times daily as needed for muscle spasms. 20 tablet 0  . diclofenac (VOLTAREN) 75 MG EC tablet Take 75 mg by mouth 2 (two) times daily.    . traMADol (ULTRAM) 50 MG tablet Take 50 mg by mouth 2 (two) times daily as needed (for pain).     No current facility-administered medications on file prior to visit.    History reviewed. No pertinent family history. Social History   Social History  . Marital status: Married    Spouse name: N/A  . Number of children: N/A  . Years of education: N/A   Occupational History  . unemployed    Social History Main Topics  . Smoking status: Never Smoker  . Smokeless tobacco: Never Used  . Alcohol use No  .  Drug use: No  . Sexual activity: Not on file   Other Topics Concern  . Not on file   Social History Narrative  . No narrative on file    Review of Systems: Constitutional: Negative Skin: Negative HENT: Negative  Eyes: Positive for occ floaters Neck: Negative Respiratory: Negative Cardiovascular: Negative Gastrointestinal: Positive for heartburn related to certain foods.  Genitourinary: Negative  Musculoskeletal: Negative   Neurological: Positive for occ headaches, not severe Hematological: Negative  Psychiatric/Behavioral: Negative  GYN: intermittent itching in genital area   Objective:   Vitals:   12/25/15 0939  BP: 135/75  Pulse: 89  Resp: 18  Temp: 98 F (36.7 C)    Physical Exam: Constitutional: Patient appears well-developed and well-nourished. No distress. HENT: Normocephalic, atraumatic, External right and left ear normal. Oropharynx is clear and moist.  Eyes: Conjunctivae and EOM are normal. PERRLA, no scleral icterus. Neck: Normal ROM. Neck supple. No lymphadenopathy, No thyromegaly. CVS: RRR, S1/S2 +, no murmurs, no gallops, no rubs Pulmonary: Effort and breath sounds normal, no stridor, rhonchi, wheezes, rales.  Abdominal: Soft. Normoactive BS,, no distension, tenderness, rebound or guarding.  Musculoskeletal: Normal range of motion. No edema and no tenderness.  Neuro: Alert.Normal muscle tone coordination. Non-focal Skin: Skin is warm and dry. No rash noted. Not diaphoretic. No erythema. No  pallor. Psychiatric: Normal mood and affect. Behavior, judgment, thought content normal.  Lab Results  Component Value Date   WBC 10.1 06/07/2015   HGB 15.5 (H) 06/07/2015   HCT 49.0 (H) 06/07/2015   MCV 81.0 06/07/2015   PLT 253 06/07/2015   Lab Results  Component Value Date   CREATININE 0.69 06/07/2015   BUN 18 06/07/2015   NA 138 06/07/2015   K 3.7 06/07/2015   CL 103 06/07/2015   CO2 25 06/07/2015    No results found for: HGBA1C Lipid Panel  No  results found for: CHOL, TRIG, HDL, CHOLHDL, VLDL, LDLCALC     Assessment and plan:   1. Diabetes mellitus of other type with complication ([place])  - Microalbumin, urine - COMPLETE METABOLIC PANEL WITH GFR - CBC with Differential - Lipid panel - Ambulatory referral to Ophthalmology  2. Special screening for malignant neoplasms, colon  - POC Hemoccult Bld/Stl (3-Cd Home Screen); Future  3. Screening for HIV (human immunodeficiency virus)  - HIV antibody (with reflex)  4. Need for hepatitis C screening test  - Hepatitis C Antibody   Return in about 6 months (around 06/23/2016) for Diabetes, A1C.  The patient was given clear instructions to go to ER or return to medical center if symptoms don't improve, worsen or new problems develop. The patient verbalized understanding.    Micheline Chapman FNP  12/25/2015, 3:12 PM

## 2015-12-25 NOTE — Patient Instructions (Signed)
Follow-up in 6 months. Try to follow low carb, heart healthy diet and exercise regularly.

## 2015-12-25 NOTE — Progress Notes (Signed)
Patient is here for FU  Patient has not taken medication today and patient has not eaten.  Patient denies pain at this time.

## 2015-12-26 LAB — HEPATITIS C ANTIBODY: HCV Ab: NEGATIVE

## 2015-12-26 LAB — MICROALBUMIN, URINE: Microalb, Ur: 3.6 mg/dL

## 2015-12-26 LAB — HIV ANTIBODY (ROUTINE TESTING W REFLEX): HIV: NONREACTIVE

## 2016-01-07 ENCOUNTER — Telehealth: Payer: Self-pay | Admitting: Family Medicine

## 2016-01-07 NOTE — Telephone Encounter (Signed)
Patient complains of dizziness and nausea with Metformin usage. She took an extended release previously without side effects. Would like to know what to do. Sharon Seller, NP advises that patient may stop Metformin and watch diet closely and recheck bloodwork in 3 months. This is agreeable to her.

## 2016-01-21 ENCOUNTER — Ambulatory Visit: Payer: No Typology Code available for payment source

## 2016-01-21 ENCOUNTER — Other Ambulatory Visit: Payer: Self-pay | Admitting: Family Medicine

## 2016-01-21 ENCOUNTER — Other Ambulatory Visit: Payer: Self-pay

## 2016-01-21 DIAGNOSIS — R030 Elevated blood-pressure reading, without diagnosis of hypertension: Secondary | ICD-10-CM

## 2016-01-21 MED ORDER — AMLODIPINE BESYLATE 5 MG PO TABS: 5.0000 mg | ORAL_TABLET | Freq: Every day | ORAL | 1 refills | Status: DC

## 2016-02-15 DIAGNOSIS — R030 Elevated blood-pressure reading, without diagnosis of hypertension: Secondary | ICD-10-CM | POA: Insufficient documentation

## 2016-04-24 ENCOUNTER — Ambulatory Visit: Payer: No Typology Code available for payment source | Admitting: Family Medicine

## 2016-04-25 ENCOUNTER — Encounter: Payer: Self-pay | Admitting: Family Medicine

## 2016-04-25 ENCOUNTER — Ambulatory Visit (INDEPENDENT_AMBULATORY_CARE_PROVIDER_SITE_OTHER): Payer: No Typology Code available for payment source | Admitting: Family Medicine

## 2016-04-25 VITALS — BP 146/89 | HR 92 | Temp 97.8°F | Resp 18 | Wt 176.0 lb

## 2016-04-25 DIAGNOSIS — R300 Dysuria: Secondary | ICD-10-CM

## 2016-04-25 DIAGNOSIS — R7303 Prediabetes: Secondary | ICD-10-CM | POA: Insufficient documentation

## 2016-04-25 DIAGNOSIS — R3915 Urgency of urination: Secondary | ICD-10-CM

## 2016-04-25 DIAGNOSIS — R350 Frequency of micturition: Secondary | ICD-10-CM

## 2016-04-25 DIAGNOSIS — N760 Acute vaginitis: Secondary | ICD-10-CM

## 2016-04-25 DIAGNOSIS — I10 Essential (primary) hypertension: Secondary | ICD-10-CM

## 2016-04-25 DIAGNOSIS — B9689 Other specified bacterial agents as the cause of diseases classified elsewhere: Secondary | ICD-10-CM

## 2016-04-25 LAB — CBC WITH DIFFERENTIAL/PLATELET
BASOS ABS: 0 {cells}/uL (ref 0–200)
Basophils Relative: 0 %
EOS ABS: 930 {cells}/uL — AB (ref 15–500)
Eosinophils Relative: 10 %
HCT: 45.3 % — ABNORMAL HIGH (ref 35.0–45.0)
Hemoglobin: 14.7 g/dL (ref 11.7–15.5)
LYMPHS PCT: 34 %
Lymphs Abs: 3162 cells/uL (ref 850–3900)
MCH: 26.1 pg — AB (ref 27.0–33.0)
MCHC: 32.5 g/dL (ref 32.0–36.0)
MCV: 80.3 fL (ref 80.0–100.0)
MONOS PCT: 6 %
MPV: 10.4 fL (ref 7.5–12.5)
Monocytes Absolute: 558 cells/uL (ref 200–950)
Neutro Abs: 4650 cells/uL (ref 1500–7800)
Neutrophils Relative %: 50 %
PLATELETS: 244 10*3/uL (ref 140–400)
RBC: 5.64 MIL/uL — ABNORMAL HIGH (ref 3.80–5.10)
RDW: 14.3 % (ref 11.0–15.0)
WBC: 9.3 10*3/uL (ref 3.8–10.8)

## 2016-04-25 LAB — COMPLETE METABOLIC PANEL WITH GFR
ALT: 23 U/L (ref 6–29)
AST: 22 U/L (ref 10–35)
Albumin: 4.5 g/dL (ref 3.6–5.1)
Alkaline Phosphatase: 108 U/L (ref 33–130)
BILIRUBIN TOTAL: 0.4 mg/dL (ref 0.2–1.2)
BUN: 14 mg/dL (ref 7–25)
CO2: 23 mmol/L (ref 20–31)
CREATININE: 0.69 mg/dL (ref 0.50–0.99)
Calcium: 10.1 mg/dL (ref 8.6–10.4)
Chloride: 104 mmol/L (ref 98–110)
GFR, Est African American: >89 mL/min (ref >=60)
GFR, Est Non African American: >89 mL/min (ref >=60)
GLUCOSE: 113 mg/dL — AB (ref 65–99)
Potassium: 3.9 mmol/L (ref 3.5–5.3)
SODIUM: 140 mmol/L (ref 135–146)
TOTAL PROTEIN: 7.4 g/dL (ref 6.1–8.1)

## 2016-04-25 LAB — POCT WET PREP (WET MOUNT)
CLUE CELLS WET PREP WHIFF POC: POSITIVE
Trichomonas Wet Prep HPF POC: ABSENT

## 2016-04-25 LAB — POCT GLYCOSYLATED HEMOGLOBIN (HGB A1C): HEMOGLOBIN A1C: 6.1

## 2016-04-25 MED ORDER — METFORMIN HCL ER 500 MG PO TB24: 500.0000 mg | ORAL_TABLET | Freq: Every day | ORAL | 5 refills | Status: DC

## 2016-04-25 MED ORDER — METRONIDAZOLE 500 MG PO TABS: 500.0000 mg | ORAL_TABLET | Freq: Two times a day (BID) | ORAL | 0 refills | Status: DC

## 2016-04-25 MED ORDER — AMLODIPINE BESYLATE 5 MG PO TABS: 5.0000 mg | ORAL_TABLET | Freq: Every day | ORAL | 1 refills | Status: DC

## 2016-04-25 NOTE — Progress Notes (Signed)
Subjective:    Patient ID: Laura Richardson, female    DOB: 31-Jan-1956, 61 y.o.   MRN: [id number]  Laura Richardson, a  61 year old female with a history of prediabetes and hypertension presents complaining of urinary frequency.  Urinary Frequency   This is a new problem. The current episode started in the past 7 days. The problem occurs every urination. The problem has been gradually worsening. The quality of the pain is described as burning. The pain is at a severity of 4/10. The pain is mild. She is not sexually active. There is no history of pyelonephritis. Associated symptoms include flank pain, frequency and urgency. The treatment provided no relief.    Past Medical History:  Diagnosis Date  . Cough   . Diabetes mellitus without complication ([place])   . Gout   . HTN (hypertension)    Social History   Social History Narrative  . No narrative on file   There is no immunization history on file for this patient.  No Known Allergies   Review of Systems  Constitutional: Negative.   HENT: Negative.   Eyes: Negative.  Negative for photophobia and visual disturbance.  Cardiovascular: Negative.   Gastrointestinal: Negative for abdominal pain.  Endocrine: Negative for polydipsia, polyphagia and polyuria.  Genitourinary: Positive for dysuria, flank pain, frequency and urgency.  Skin: Negative.   Neurological: Negative.   Psychiatric/Behavioral: Negative.        Objective:   Physical Exam  Constitutional: She is oriented to person, place, and time. She appears well-developed and well-nourished.  HENT:  Head: Normocephalic and atraumatic.  Right Ear: External ear normal.  Left Ear: External ear normal.  Nose: Nose normal.  Mouth/Throat: Oropharynx is clear and moist.  Eyes: Conjunctivae and EOM are normal. Pupils are equal, round, and reactive to light.  Neck: Normal range of motion. Neck supple.  Cardiovascular: Normal rate, regular rhythm, normal heart sounds and  intact distal pulses.   Pulmonary/Chest: Effort normal and breath sounds normal.  Abdominal: Soft. Bowel sounds are normal.  Neurological: She is alert and oriented to person, place, and time. She has normal reflexes.  Skin: Skin is warm and dry.  Psychiatric: She has a normal mood and affect. Her behavior is normal. Judgment and thought content normal.   BP (!) 146/89 (BP Location: Right Arm, Patient Position: Sitting)   Pulse 92   Temp 97.8 F (36.6 C) (Oral)   Resp 18   Wt 176 lb (79.8 kg)   SpO2 99%   BMI 30.21 kg/m     Assessment & Plan:  1. Urinary frequency Reviewed urinalysis, no negative findings  2. Essential hypertension Blood pressure is at goal on current medication regimen - amLODipine (NORVASC) 5 MG tablet; Take 1 tablet (5 mg total) by mouth daily.  Dispense: 90 tablet; Refill: 1 - COMPLETE METABOLIC PANEL WITH GFR - CBC with Differential  3. Urinary urgency Reviewed urinalysis, no negative findings  4. Dysuria Reviewed urinalysis, no negative findings   5. Prediabetes Recommend a lowfat, low carbohydrate diet divided over 5-6 small meals, increase water intake to 6-8 glasses, and 150 minutes per week of cardiovascular exercise.   - metFORMIN (GLUCOPHAGE XR) 500 MG 24 hr tablet; Take 1 tablet (500 mg total) by mouth daily with breakfast.  Dispense: 30 tablet; Refill: 5  6. BV (bacterial vaginosis) - POCT Wet Prep ([place]) - metroNIDAZOLE (FLAGYL) 500 MG tablet; Take 1 tablet (500 mg total) by mouth 2 (two) times daily.  Dispense: 14 tablet; Refill: 0   RTC: As previously scheduled for hypertension and prediabetes     Dorena Dew, FNP

## 2016-04-25 NOTE — Patient Instructions (Addendum)
Vaginosis bacteriana (Bacterial Vaginosis) La vaginosis bacteriana es una infeccin de la vagina. Se produce cuando crece una cantidad excesiva de grmenes normales (bacterias sanas) en la vagina. Esta infeccin aumenta el riesgo de contraer otras infecciones de transmisin sexual. El tratamiento de esta infeccin puede ayudar a reducir el riesgo de otras infecciones, como:  Clamidia.  Roderick Pee.  VIH.  Herpes. [place] los medicamentos tal como se lo indic su mdico.  Finalice la prescripcin completa, aunque comience a sentirse mejor.  Comunique a sus compaeros sexuales que sufre una infeccin. Deben consultar a su mdico para iniciar un tratamiento.  Durante el tratamiento:  Teacher, music o use preservativos de Cabin crew.  No se haga duchas vaginales.  No consuma alcohol a menos que el mdico lo autorice.  No amamante a menos que el mdico la autorice. SOLICITE AYUDA SI:  No mejora luego de 3 das de tratamiento.  Observa una secrecin (prdida) de color gris ms abundante que proviene de la vagina.  Siente ms dolor que antes.  Tiene fiebre. ASEGRESE DE QUE:  Comprende estas instrucciones.  Controlar su afeccin.  Recibir ayuda de inmediato si no mejora o si empeora. Esta informacin no tiene Marine scientist el consejo del mdico. Asegrese de hacerle al mdico cualquier pregunta que tenga. Document Released: 06/20/2008 Document Revised: 07/16/2015 Document Reviewed: 11/03/2012 Elsevier Interactive Patient Education  2017 [place] (Dysuria) La disuria es dolor o molestia al Garment/textile technologist. El dolor o la molestia se pueden sentir en el conducto que transporta la orina fuera de la vejiga (uretra) o en el tejido que rodea los genitales. El dolor tambin se puede sentir en la zona de la ingle y en la parte inferior del abdomen y de la espalda. Quizs tenga que orinar con frecuencia o la sensacin repentina  de tener que orinar (tenesmo vesical). La disuria puede afectar tanto a hombres como a mujeres, pero es ms comn en las mujeres. La causa puede deberse a muchos problemas diferentes:  Infeccin en las vas urinarias en mujeres.  Infeccin en los riones o la vejiga.  Clculos en los riones o la vejiga.  Ciertas enfermedades de transmisin sexual (ETS), como la clamidia.  Deshidratacin.  Inflamacin de la vagina.  Uso de ciertos medicamentos.  Uso de ciertos jabones o productos perfumados que provocan irritacin. INSTRUCCIONES PARA EL CUIDADO EN EL HOGAR Controle su disuria para ver si hay cambios. Las siguientes indicaciones pueden ayudar a [employer] pueda sentir:  Beba suficiente lquido para Theatre manager la orina clara o de color amarillo plido.  Vace la vejiga con frecuencia. Evite retener la orina durante largos perodos.  Despus de defecar, las mujeres deben limpiarse desde adelante hacia atrs, usando el papel higinico solo [place].  Vace la vejiga despus de Clinical biochemist.  Tome los medicamentos solamente como se lo haya indicado el mdico.  Si le recetaron antibiticos, asegrese de terminarlos, incluso si comienza a sentirse mejor.  Evite la cafena, el t y el alcohol. Estos productos pueden Scientist, research (medical) vejiga y Actuary disuria. En los hombres, el alcohol puede irritar la prstata.  Concurra a todas las visitas de control como se lo haya indicado el mdico. Esto es importante.  Si le realizaron pruebas para Product manager causa de la disuria, es su responsabilidad retirar los [place]. Consulte en el laboratorio o en el departamento en el que fue realizado el estudio cundo y cmo podr [employer]. Hable  con el mdico si tiene [employer]. SOLICITE ATENCIN MDICA SI:  Siente dolor en la espalda o a los costados del cuerpo.  Tiene fiebre.  Tiene nuseas o vmitos.  Observa sangre en la  orina.  Est orinando con ms frecuencia que lo habitual. SOLICITE ATENCIN MDICA DE INMEDIATO SI:  El dolor es intenso y no se alivia con los medicamentos.  No puede retener lquido.  Usted u otra persona advierten algn cambio en su funcin mental.  Tiene una frecuencia cardaca acelerada en reposo.  Tiene temblores o escalofros.  Se siente muy dbil. Esta informacin no tiene Marine scientist el consejo del mdico. Asegrese de hacerle al mdico cualquier pregunta que tenga. Document Released: 04/13/2007 Document Revised: 07/16/2015 Document Reviewed: 11/17/2013 Elsevier Interactive Patient Education  2017 [employer].

## 2016-06-23 ENCOUNTER — Other Ambulatory Visit (HOSPITAL_COMMUNITY)
Admission: RE | Admit: 2016-06-23 | Discharge: 2016-06-23 | Disposition: A | Discharge status: Home / Self Care | Payer: No Typology Code available for payment source | Source: Ambulatory Visit | Attending: Family Medicine | Admitting: Family Medicine

## 2016-06-23 ENCOUNTER — Other Ambulatory Visit: Payer: Self-pay | Admitting: Family Medicine

## 2016-06-23 ENCOUNTER — Ambulatory Visit (INDEPENDENT_AMBULATORY_CARE_PROVIDER_SITE_OTHER): Payer: No Typology Code available for payment source | Admitting: Family Medicine

## 2016-06-23 ENCOUNTER — Encounter: Payer: Self-pay | Admitting: Family Medicine

## 2016-06-23 VITALS — BP 137/68 | HR 95 | Temp 98.0°F | Resp 18 | Ht 64.0 in | Wt 176.0 lb

## 2016-06-23 DIAGNOSIS — R102 Pelvic and perineal pain: Secondary | ICD-10-CM | POA: Insufficient documentation

## 2016-06-23 DIAGNOSIS — R7303 Prediabetes: Secondary | ICD-10-CM

## 2016-06-23 DIAGNOSIS — R300 Dysuria: Secondary | ICD-10-CM

## 2016-06-23 DIAGNOSIS — Z1231 Encounter for screening mammogram for malignant neoplasm of breast: Secondary | ICD-10-CM

## 2016-06-23 DIAGNOSIS — I10 Essential (primary) hypertension: Secondary | ICD-10-CM

## 2016-06-23 LAB — POCT URINALYSIS DIP (DEVICE)
Bilirubin Urine: NEGATIVE
Glucose, UA: NEGATIVE mg/dL
HGB URINE DIPSTICK: NEGATIVE
KETONES UR: NEGATIVE mg/dL
Nitrite: NEGATIVE
PROTEIN: NEGATIVE mg/dL
Specific Gravity, Urine: 1.015 (ref 1.005–1.030)
Urobilinogen, UA: 0.2 mg/dL (ref 0.0–1.0)
pH: 6.5 (ref 5.0–8.0)

## 2016-06-23 LAB — POCT GLYCOSYLATED HEMOGLOBIN (HGB A1C): HEMOGLOBIN A1C: 6.3

## 2016-06-23 MED ORDER — METFORMIN HCL ER 500 MG PO TB24: 500.0000 mg | ORAL_TABLET | Freq: Every day | ORAL | 5 refills | Status: DC

## 2016-06-23 NOTE — Patient Instructions (Signed)
[place] program for mammograms: [phone]

## 2016-06-23 NOTE — Progress Notes (Signed)
Subjective:    Patient ID: Laura Richardson, female    DOB: 1956-01-19, 61 y.o.   MRN: [id number]  Laura Richardson, a  61 year old female with a history of prediabetes and hypertension presents complaining of urinary frequency.  Hypertension:  Patient here for follow-up of hypertension.  She is not exercising and is not adherent to low salt diet.  She does not check blood pressure at home. She is taking antihypertensive medications consistently. Patient denies chest pain, chest pressure/discomfort, dyspnea, fatigue, near-syncope, orthopnea and tachypnea.  Cardiovascular risk factors include prediabetes and a sedentary lifestyle Pre-Diabetes:  Patient also has a history of pre diabetes. Previous hemoglobin a1C was 6.1. She says that diet is not carbohydrate modified. Symptoms of diabetes include polyuria. She also endorses urinary frequency and dysuria. She denies  foot ulcerations, paresthesia of the feet, polydipsia, visual disturbances, vomitting and weight loss.   HPI   Past Medical History:  Diagnosis Date  . Cough   . Diabetes mellitus without complication ([place])   . Gout   . HTN (hypertension)    Social History   Social History Narrative  . No narrative on file   There is no immunization history on file for this patient.  No Known Allergies  Review of Systems  Constitutional: Negative.   HENT: Negative.   Eyes: Negative.  Negative for photophobia and visual disturbance.  Cardiovascular: Negative.   Gastrointestinal: Negative for abdominal pain.  Endocrine: Positive for polyuria. Negative for polydipsia and polyphagia.  Genitourinary: Positive for dysuria, frequency, pelvic pain and urgency.  Skin: Negative.   Neurological: Negative.   Psychiatric/Behavioral: Negative.        Objective:   Physical Exam  Constitutional: She is oriented to person, place, and time. She appears well-developed and well-nourished.  HENT:  Head: Normocephalic and atraumatic.  Right  Ear: External ear normal.  Left Ear: External ear normal.  Nose: Nose normal.  Mouth/Throat: Oropharynx is clear and moist.  Eyes: Conjunctivae and EOM are normal. Pupils are equal, round, and reactive to light.  Neck: Normal range of motion. Neck supple.  Cardiovascular: Normal rate, regular rhythm, normal heart sounds and intact distal pulses.   Pulmonary/Chest: Effort normal and breath sounds normal.  Abdominal: Soft. Bowel sounds are normal.  Genitourinary: There is no rash on the right labia. There is no rash on the left labia. Cervix exhibits discharge and friability. Cervix exhibits no motion tenderness. There is erythema in the vagina. No tenderness in the vagina. No vaginal discharge found.  Neurological: She is alert and oriented to person, place, and time. She has normal reflexes.  Skin: Skin is warm and dry.  Psychiatric: She has a normal mood and affect. Her behavior is normal. Judgment and thought content normal.   BP 137/68 (BP Location: Left Arm, Patient Position: Sitting, Cuff Size: Normal)   Pulse 95   Temp 98 F (36.7 C) (Oral)   Resp 18   Ht 5\' 4"  (1.626 m)   Wt 176 lb (79.8 kg)   SpO2 99%   BMI 30.21 kg/m     Assessment & Plan:  1. Essential hypertension Blood pressure is at goal on current medication regimen. Will continue.  The patient is asked to make an attempt to improve diet and exercise patterns to aid in medical management of this problem. - POCT urinalysis dip (device)  2. Prediabetes Recommend a lowfat, low carbohydrate diet divided over 5-6 small meals, increase water intake to 6-8 glasses, and 150 minutes per  week of cardiovascular exercise.   - HgB A1c - metFORMIN (GLUCOPHAGE XR) 500 MG 24 hr tablet; Take 1 tablet (500 mg total) by mouth daily with breakfast.  Dispense: 30 tablet; Refill: 5  3. Pelvic pain - Cytology - PAP [place]  4. Dysuria - Urine culture   RTC: Will follow up by phone with laboratory results. Will follow up in 3  months for hypertension and prediabetes.    Donia Pounds MSN, FNP-C [facility] [address] [place], [place] [address] [phone]

## 2016-06-25 LAB — URINE CULTURE

## 2016-06-26 LAB — CYTOLOGY - PAP
BACTERIAL VAGINITIS: NEGATIVE
CANDIDA VAGINITIS: NEGATIVE
Chlamydia: NEGATIVE
Diagnosis: NEGATIVE
HPV (WINDOPATH): NOT DETECTED
Neisseria Gonorrhea: NEGATIVE

## 2016-06-29 ENCOUNTER — Other Ambulatory Visit: Payer: Self-pay | Admitting: Family Medicine

## 2016-06-29 DIAGNOSIS — R102 Pelvic and perineal pain: Secondary | ICD-10-CM

## 2016-07-03 ENCOUNTER — Encounter: Payer: Self-pay | Admitting: Family Medicine

## 2016-07-03 ENCOUNTER — Ambulatory Visit (INDEPENDENT_AMBULATORY_CARE_PROVIDER_SITE_OTHER): Payer: No Typology Code available for payment source | Admitting: Family Medicine

## 2016-07-03 VITALS — BP 136/80 | HR 86 | Temp 98.7°F | Ht 64.0 in | Wt 176.0 lb

## 2016-07-03 DIAGNOSIS — R102 Pelvic and perineal pain: Secondary | ICD-10-CM

## 2016-07-03 DIAGNOSIS — N951 Menopausal and female climacteric states: Secondary | ICD-10-CM

## 2016-07-03 DIAGNOSIS — N898 Other specified noninflammatory disorders of vagina: Secondary | ICD-10-CM

## 2016-07-03 LAB — POCT URINALYSIS DIP (DEVICE)
BILIRUBIN URINE: NEGATIVE
Glucose, UA: NEGATIVE mg/dL
HGB URINE DIPSTICK: NEGATIVE
Ketones, ur: NEGATIVE mg/dL
Leukocytes, UA: NEGATIVE
NITRITE: NEGATIVE
PH: 7 (ref 5.0–8.0)
PROTEIN: NEGATIVE mg/dL
Specific Gravity, Urine: 1.02 (ref 1.005–1.030)
Urobilinogen, UA: 0.2 mg/dL (ref 0.0–1.0)

## 2016-07-03 LAB — CBC WITH DIFFERENTIAL/PLATELET
BASOS ABS: 0 {cells}/uL (ref 0–200)
Basophils Relative: 0 %
EOS PCT: 9 %
Eosinophils Absolute: 891 cells/uL — ABNORMAL HIGH (ref 15–500)
HEMATOCRIT: 46.2 % — AB (ref 35.0–45.0)
HEMOGLOBIN: 14.4 g/dL (ref 11.7–15.5)
LYMPHS ABS: 3366 {cells}/uL (ref 850–3900)
Lymphocytes Relative: 34 %
MCH: 25.3 pg — AB (ref 27.0–33.0)
MCHC: 31.2 g/dL — AB (ref 32.0–36.0)
MCV: 81.2 fL (ref 80.0–100.0)
MONO ABS: 594 {cells}/uL (ref 200–950)
MPV: 10.3 fL (ref 7.5–12.5)
Monocytes Relative: 6 %
NEUTROS PCT: 51 %
Neutro Abs: 5049 cells/uL (ref 1500–7800)
Platelets: 256 10*3/uL (ref 140–400)
RBC: 5.69 MIL/uL — ABNORMAL HIGH (ref 3.80–5.10)
RDW: 14.7 % (ref 11.0–15.0)
WBC: 9.9 10*3/uL (ref 3.8–10.8)

## 2016-07-03 MED ORDER — TRAMADOL HCL 50 MG PO TABS: 50.0000 mg | ORAL_TABLET | Freq: Three times a day (TID) | ORAL | 0 refills | Status: DC | PRN

## 2016-07-03 MED ORDER — REPLENS VA GEL: 1.0000 | Freq: Every day | VAGINAL | 0 refills | Status: DC

## 2016-07-03 NOTE — Progress Notes (Signed)
Subjective:    Patient ID: Laura Richardson, female    DOB: 07/07/1955, 61 y.o.   MRN: [id number]  Pelvic Pain  The patient's primary symptoms include pelvic pain. The patient's pertinent negatives include no genital itching, genital lesions, genital odor, vaginal bleeding or vaginal discharge. This is a recurrent (Patient had pelvic exam 1 week ago with a normal pap smear, negative for BV, candida, GC/chlamydia) problem. The current episode started more than 1 month ago. The problem occurs constantly. The pain is moderate. The problem affects the left side. She is not pregnant. Associated symptoms include dysuria. Pertinent negatives include no abdominal pain, anorexia, back pain, chills, constipation, diarrhea, discolored urine, fever, flank pain, headaches, hematuria, joint pain, joint swelling, nausea, painful intercourse, rash, sore throat or vomiting. The symptoms are aggravated by urinating. She is not sexually active. She uses nothing for contraception. She is postmenopausal. Her past medical history is significant for vaginosis. There is no history of a gynecological surgery or miscarriage.     Past Medical History:  Diagnosis Date  . Cough   . Diabetes mellitus without complication ([place])   . Gout   . HTN (hypertension)    Social History   Social History Narrative  . No narrative on file   There is no immunization history on file for this patient.  No Known Allergies  Review of Systems  Constitutional: Negative.  Negative for chills and fever.  HENT: Negative.  Negative for sore throat.   Eyes: Negative.  Negative for photophobia and visual disturbance.  Cardiovascular: Negative.   Gastrointestinal: Negative for abdominal pain, anorexia, constipation, diarrhea, nausea and vomiting.  Endocrine: Negative for polydipsia and polyphagia.  Genitourinary: Positive for dysuria and pelvic pain. Negative for flank pain, hematuria and vaginal discharge.  Musculoskeletal: Negative for  back pain and joint pain.  Skin: Negative.  Negative for rash.  Neurological: Negative.  Negative for headaches.  Psychiatric/Behavioral: Negative.        Objective:   Physical Exam  Constitutional: She is oriented to person, place, and time. She appears well-developed and well-nourished.  HENT:  Head: Normocephalic and atraumatic.  Right Ear: External ear normal.  Left Ear: External ear normal.  Nose: Nose normal.  Mouth/Throat: Oropharynx is clear and moist.  Eyes: Conjunctivae and EOM are normal. Pupils are equal, round, and reactive to light.  Neck: Normal range of motion. Neck supple.  Cardiovascular: Normal rate, regular rhythm, normal heart sounds and intact distal pulses.   Pulmonary/Chest: Effort normal and breath sounds normal.  Abdominal: Soft. Bowel sounds are normal.  Genitourinary: There is no rash on the right labia. There is no rash on the left labia. Cervix exhibits discharge and friability. Cervix exhibits no motion tenderness. There is erythema in the vagina. No tenderness in the vagina. No vaginal discharge found.  Neurological: She is alert and oriented to person, place, and time. She has normal reflexes.  Skin: Skin is warm and dry.  Psychiatric: She has a normal mood and affect. Her behavior is normal. Judgment and thought content normal.   BP 136/80 (BP Location: Right Arm, Patient Position: Sitting, Cuff Size: Small)   Pulse 86   Temp 98.7 F (37.1 C) (Oral)   Ht 5\' 4"  (1.626 m)   Wt 176 lb (79.8 kg)   SpO2 99%   BMI 30.21 kg/m     Assessment & Plan:  1. Pelvic pain in female - US Pelvis Complete; Future - US Transvaginal Non-OB; Future - CBC with Differential -  traMADol (ULTRAM) 50 MG tablet; Take 1 tablet (50 mg total) by mouth every 8 (eight) hours as needed.  Dispense: 20 tablet; Refill: 0  2. Vaginal dryness - Vaginal Lubricant (REPLENS) GEL; Place 1 each vaginally at bedtime.  Dispense: 35 g; Refill: 0  3. Post menopausal Cool hot flashes.  Dress in layers, have a cold glass of water or go somewhere cooler. Try to pinpoint what triggers your hot flashes. For many women, triggers may include hot beverages, caffeine, spicy foods, alcohol, stress, hot weather and even a warm room.  Decrease vaginal discomfort. Use over-the-counter, water-based vaginal lubricants (Astroglide, K-Y jelly, others), silicone-based lubricants or moisturizers (Replens, others). Choose products that don't contain glycerin, which can cause burning or irritation in women who are sensitive to that chemical. Staying sexually active also helps by increasing blood flow to the vagina.  Get enough sleep. Avoid caffeine, which can make it hard to get to sleep, and avoid drinking too much alcohol, which can interrupt sleep. Exercise during the day, although not right before bedtime. If hot flashes disturb your sleep, you may need to find a way to manage them before you can get adequate rest.  Practice relaxation techniques. Techniques such as deep breathing, paced breathing, guided imagery, massage and progressive muscle relaxation may help with menopausal symptoms. You can find a number of books, CDs and online offerings on different relaxation exercises.  Strengthen your pelvic floor. Pelvic floor muscle exercises, called Kegel exercises, can improve some forms of urinary incontinence.  Eat a balanced diet. Include a variety of fruits, vegetables and whole grains. Limit saturated fats, oils and sugars. Ask your provider if you need calcium or vitamin D supplements to help meet daily requirements.  Don't smoke. Smoking increases your risk of heart disease, stroke, osteoporosis, cancer and a range of other health problems. It may also increase hot flashes and bring on earlier menopause.  Exercise regularly. Get regular physical activity or exercise on most days to help protect against heart disease, diabetes, osteoporosis and other conditions associated with aging.   RTC: Will  follow up by phone with laboratory results. Will follow up in 3 months for hypertension and prediabetes.    Donia Pounds MSN, FNP-C [facility] [address] [place], [place] [address] [phone]

## 2016-07-03 NOTE — Patient Instructions (Addendum)
Replens pads sent to [facility] Pelvic and transvaginal ultrasound ordered.  Black Cohosh (vitamin section) Vitamin D (2000 units) Cool hot flashes. Dress in layers, have a cold glass of water or go somewhere cooler. Try to pinpoint what triggers your hot flashes. For many women, triggers may include hot beverages, caffeine, spicy foods, alcohol, stress, hot weather and even a warm room.  Decrease vaginal discomfort. Use over-the-counter, water-based vaginal lubricants (Astroglide, K-Y jelly, others), silicone-based lubricants or moisturizers (Replens, others). Choose products that don't contain glycerin, which can cause burning or irritation in women who are sensitive to that chemical. Staying sexually active also helps by increasing blood flow to the vagina.  Get enough sleep. Avoid caffeine, which can make it hard to get to sleep, and avoid drinking too much alcohol, which can interrupt sleep. Exercise during the day, although not right before bedtime. If hot flashes disturb your sleep, you may need to find a way to manage them before you can get adequate rest.  Practice relaxation techniques. Techniques such as deep breathing, paced breathing, guided imagery, massage and progressive muscle relaxation may help with menopausal symptoms. You can find a number of books, CDs and online offerings on different relaxation exercises.  Strengthen your pelvic floor. Pelvic floor muscle exercises, called Kegel exercises, can improve some forms of urinary incontinence.  Eat a balanced diet. Include a variety of fruits, vegetables and whole grains. Limit saturated fats, oils and sugars. Ask your provider if you need calcium or vitamin D supplements to help meet daily requirements.  Don't smoke. Smoking increases your risk of heart disease, stroke, osteoporosis, cancer and a range of other health problems. It may also increase hot flashes and bring on earlier menopause.  Exercise regularly. Get regular physical  activity or exercise on most days to help protect against heart disease, diabetes, osteoporosis and other conditions associated with aging.    La menopausia y los productos herbarios (Menopause and Herbal Products) QU ES [place]? La menopausia es una etapa normal de la vida en la que se reduce la frecuencia de los perodos [employer], finalmente, se detienen por completo. En algunas mujeres, este proceso puede llevar varios aos. La menopausia se considera completa cuando ha transcurrido un ao completo desde el ltimo perodo menstrual. Por lo general, esto ocurre entre los 23 y los 67 aos. No es comn que la menopausia comience antes de los 40 Star. Durante la menopausia, el cuerpo deja de producir las hormonas femeninas, estrgeno y Immunologist. Los sntomas comunes relacionados con esta prdida de hormonas (sntomas vasomotores) son:  Acaloramiento.  Sofocos.  Sudoracin nocturna. Entre otros sntomas y complicaciones comunes de la menopausia se incluyen los siguientes:  Disminucin del deseo sexual.  Sequedad vaginal y adelgazamiento de las paredes de la vagina. Esto puede hacer que el sexo resulte doloroso.  Sequedad de la piel y aparicin de Glass blower/designer.  Dolores de [place].  Cansancio.  Irritabilidad.  Problemas de memoria.  Aumento de [place].  Infecciones de la vejiga.  Crecimiento de bello en la cara y el pecho.  Incapacidad para reproducir (infertilidad).  Prdida de la densidad sea (osteoporosis), lo que aumenta el riesgo de roturas (fracturas).  Depresin.  Endurecimiento y Public librarian de las arterias (ateroesclerosis). Esto aumenta el riesgo de infarto de miocardio y de ictus. QU OPCIONES DE TRATAMIENTO HAY DISPONIBLES? Hay muchas opciones de tratamiento para los sntomas de la [place]. El tratamiento ms comn es la terapia de reemplazo hormonal. Estn surgiendo muchas terapias alternativas para la [place],  incluido el uso de  productos herbarios. Estos suplementos pueden encontrarse en forma de hierbas, ts, aceites, tinturas y pastillas. Los suplementos herbarios comunes para la menopausia se elaboran con plantas que contienen fitoestrgenos. Los fitoestrgenos son compuestos naturales de las plantas y los productos de origen vegetal. Actan como estrgenos en el organismo. Los alimentos y las hierbas que contienen fitoestrgenos incluyen los siguientes:  [place].  Semillas de lino.  Trbol rojo.  Ginseng. QU SNTOMAS DE LA MENOPAUSIA PUEDO ALIVIAR SI USO PRODUCTOS HERBARIOS?  Sntomas vasomotores. Estos pueden aliviarse con:  Soja. Algunos estudios demuestran que la soja puede ofrecer un beneficio moderado para los calores.  Planta medicinal Cimcifuga racemosa. Hay pocas evidencias que indiquen que esta hierba puede ser beneficiosa para los calores.  Sntomas relacionados con la cardiopata coronaria y las enfermedades de los vasos sanguneos. Estos pueden aliviarse con la ingesta de soja. Los estudios han demostrado que la soja ayuda a Designer, fashion/clothing.  Depresin. Puede aliviarse con:  Hierba de [place]. No hay muchas evidencias que demuestren que esta hierba puede aliviar la depresin leve a moderada.  Planta medicinal Cimcifuga racemosa. Existen evidencias de que esta hierba puede Public house manager la depresin y los cambios en el estado de nimo.  Osteoporosis. La soja puede ayudar a reducir la disminucin de la masa sea relacionada con la menopausia y puede prevenir la osteoporosis. Hay pocas evidencias que indiquen que el trbol rojo tambin puede evitar la disminucin de la masa sea. No hay evidencias suficientes que indiquen que otros productos herbarios que se utilizan comnmente durante la menopausia sirvan como reemplazo de las terapias convencionales para la menopausia. Estos productos incluyen la prmula nocturna, el ginseng y el trbol rojo. EN QU CASOS NO [place]? No use productos herbarios durante la menopausia sin la autorizacin de su mdico en los siguientes casos:  Si est tomando medicamentos.  Si tiene una enfermedad heptica preexistente. [place] LOS RIESGOS DE TOMAR PRODUCTOS HERBARIOS DURANTE [place]? Si opta por usar productos herbarios para ayudar a [employer] de la menopausia, tenga en cuenta lo siguiente:  Cada suplemento tiene cantidades diferentes e ilimitadas de ingredientes herbarios.  Los productos herbarios no estn regulados de la misma [employer].  Las concentraciones de hierbas pueden variar segn la manera en que se preparen. Por ejemplo, la concentracin puede ser diferente en [place], un t, aceite y [place].  Hay poca informacin sobre los riesgos de usar productos herbarios, en especial sobre los riesgos del uso prolongado.  Algunos suplementos herbarios pueden ser perjudiciales cuando se combinan con determinados medicamentos. Los efectos secundarios de los productos herbarios que se informan ms comnmente son leves. No obstante, si se usan de [employer], muchos suplementos herbarios pueden causar problemas graves. Hable con su mdico antes de comenzar a usar un producto herbario. Si se presentan problemas, deje de tomar el suplemento e informe a su mdico al respecto. Esta informacin no tiene Marine scientist el consejo del mdico. Asegrese de hacerle al mdico cualquier pregunta que tenga. Document Released: 12/17/2011 Document Revised: 04/14/2014 Document Reviewed: 09/06/2013 Elsevier Interactive Patient Education  2017 Elsevier Inc.  Menopausia y terapia de reemplazo hormonal (Menopause and Hormone Replacement Therapy) QU ES LA TERAPIA DE REEMPLAZO HORMONAL? La terapia de reemplazo hormonal (TRH) es el uso de hormonas artificiales (sintticas) para Training and development officer las hormonas que el cuerpo deja de producir durante la menopausia. La  menopausia es la etapa normal de la vida cuando  los perodos Kellogg desaparecen y los ovarios dejan de producir hormonas femeninas, estrgenos y Immunologist. Esta falta de hormonas puede afectar la salud y causar sntomas indeseables. La TRH puede aliviar algunos de esos sntomas. CULES SON MIS OPCIONES PARA UNA TRH? La TRH puede consistir en hormonas sintticas de Oceanographer y Immunologist o puede consistir solamente en estrgeno (terapia de estrgenos solamente). Usted y su mdico decidirn qu tipo de TRH es la ms [place] para usted. Si elige Lucilla Edin TRH y tiene tero, generalmente se prescribe estrgeno y progesterona. La terapia de estrgenos solamente se [place] para las mujeres que no tienen tero. Las siguientes son algunas de las opciones para una TRH:  Pldoras.  Parches.  Geles.  Aerosoles.  Cremas vaginales.  Anillo vaginal.  Dispositivos vaginales. La cantidad de hormonas que tome y Physiological scientist en que deba tomarlas depender de su salud en particular. Es importante:  Comenzar una TRH con la dosificacin ms baja posible.  Interrumpa la TRH tan pronto como su mdico se lo indique.  Colabore con su mdico para estar bien informado y sentirse cmodo con sus decisiones. CULES SON LOS BENEFICIOS DE LA TRH? La TRH puede reducir la frecuencia y la gravedad de los sntomas de la menopausia. Los beneficios de la TRH pueden variar segn los sntomas de la menopausia que tenga, la gravedad de estos y su salud general. La TRH puede ayudarle a mejorar los siguientes sntomas de la menopausia:  Sofocones y sudores nocturnos. Estos consisten en una sensacin de calor que se extiende por el rostro y el cuerpo. La piel se puede volver roja, como ruborizada. Los sudores nocturnos son oleadas de calor que ocurren al estar dormida o tratando de dormir.  Prdida de masa sea (osteoporosis). El organismo pierde calcio ms rpido despus de la menopausia, lo que debilita a los [place].  Esto puede aumentar el riesgo de huesos rotos (fracturas).  Sequedad vaginal. La membrana de la vagina se hace ms delgada y seca lo que puede causar dolor durante las relaciones sexuales o causar infecciones, ardor o picazn.  Infecciones en las vas urinarias.  Incontinencia urinaria. Esta es la disminucin de la capacidad de Chief Technology Officer la [place].  Irritabilidad.  Problemas de memoria de [employer] Partners. CULES SON LOS RIESGOS DE LA TRH? Los riesgos de la TRH pueden variar segn su salud y su historia clnica. Los riesgos de la TRH tambin dependen de si usted recibe estrgeno y progesterona o solamente estrgeno.La TRH puede aumentar el riesgo de:  Tener prdidas. Estas ocurren cuando la vagina pierde repentinamente una pequea cantidad de [place].  Cncer de endometrio. Este cncer ocurre en la membrana del tero (endometrio).  Cncer de mama.  Aumenta la densidad del tejido de las [place]. Esto puede dificultar la deteccin de cncer de mamas con una radiografa de mamas (mamografa).  Ictus.  Infarto de miocardio.  Cogulos sanguneos.  Enfermedad de la vescula biliar. Los riesgos de la TRH pueden ser Nordstrom si tiene alguna de las siguientes afecciones:  Hotel manager de endometrio.  Enfermedad heptica.  Cardiopata.  Cncer de mama.  Antecedentes de cogulos sanguneos.  Antecedentes de ictus. CMO DEBO CUIDARME CUANDO ESTOY REALIZANDO UNA TRH?  Tome los medicamentos de venta libre y los recetados solamente como se lo haya indicado el mdico.  Hgase mamografas, exmenes plvicos y chequeos con la frecuencia que le indique el mdico.  Hgase pruebas de Papanicolu con la frecuencia que le indique el mdico. A esta prueba tambin se la denomina "Pap". Es Ardelia Mems prueba  de deteccin que se utiliza para detectar signos de cncer en el cuello del tero y la vagina. La prueba de Papanicolu tambin puede identificar la presencia de infeccin o cambios precancerosos. Las pruebas  de Papanicolaou se pueden Optometrist:  Cada 3 aos, a Proofreader de los 21 aos.  Cada 5 aos, a partir de los 30 aos, en combinacin con las pruebas que se realizan para Product manager presencia del virus del Engineer, technical sales (VPH).  Con mayor o menor frecuencia segn las afecciones mdicas que tenga, su edad y otros factores de [place].  Es su responsabilidad retirar el resultado de la prueba de Papanicolu. Consulte a su mdico o en el departamento donde se realice el procedimiento cundo estarn [employer].  Concurra a todas las visitas de control como se lo haya indicado el mdico. Esto es importante. [place]? Hable con el mdico en los siguientes casos:  Tiene alguno de estos sntomas:  Dolor o hinchazn en las piernas.  Falta de aire.  Dolor en el pecho.  Bultos o cambios en las mamas o en las [place].  Hablar arrastrando las palabras.  Dolor, ardor o sangrado al [employer].  Tiene alguno de estos sntomas:  Hemorragia vaginal inusual.  Mareos o dolores de [place].  Adormecimiento o debilidad en cualquier parte de los brazos o de las piernas.  Dolor en el abdomen. Esta informacin no tiene Marine scientist el consejo del mdico. Asegrese de hacerle al mdico cualquier pregunta que tenga. Document Released: 09/10/2007 Document Revised: 07/16/2015 Document Reviewed: 09/25/2014 Elsevier Interactive Patient Education  2017 [place] de la salud de las mujeres posmenopusicas (Health Maintenance for Postmenopausal Women) La menopausia es un proceso normal en el cual se pierde la capacidad reproductiva. Este proceso ocurre gradualmente a lo largo de un perodo de meses o aos, por lo general entre los 47 y los 55aos. La menopausia es completa cuando no se han tenido 1mnstruaciones consecutivas. Es importante hablar con el mdico sobre algunas de las enfermedades ms comunes que afectan a las mujeres posmenopusicas, como  la cardiopata coronaria, el cncer y la prdida de la masa sea (osteoporosis). Adoptar un estilo de vida saludable y recibir atencin preventiva pueden ayudar a promover la salud y eMusician Adems, estas medidas pueden reducir las probabilidades de desarrollar algunas de estas enfermedades frecuentes. QU DEBO SABER ACERCA DE LPort Sanilac  Durante le m[place] puede tener una serie de sntomas, por ejemplo:  Calores repentinos moderados a graves.  Sudoracin nocturna.  Disminucin del deseo sexual.  Cambios en el estado de nimo.  Dolores de c[place]  Cansancio.  Irritabilidad.  Problemas de memoria.  Insomnio. Tratar o no los cambios que ocurren en la menopausia es una decisin personal que se toma con el mdico. QU DEBO SABER SOBRE LOS TRATAMIENTOS DE REPOSICIN HORMONAL Y LOS SUPLEMENTOS? Los productos para la terapia hormonal son eficaces para tratar los sntomas que se asocian con la menopausia, como los calores repentinos y las sudoraciones nocturnas. La reposicin hormonal conlleva ciertos riesgos, especialmente a medida que una mujer envejece. Si est pensando en usar tratamientos con estrgeno o estrgeno con progesterona, analice los beneficios y los riesgos con el mdico. QU DEBO SABER SFruithurstLRio Oso A medida que una persona envejece, aumenta la probabilidad de tener cardiopata coronaria, infarto de miocardio e ictus. Esto puede deberse, en parte, a los cambios hormonales que atraviesa el cuerpo durante la menopausia. Estos cambios pueden afectar la forma  en que el organismo procesa las [place], los triglicridos y el colesterol de su dieta. El infarto de miocardio y el ictus son emergencias mdicas. Hay muchas cosas que se pueden hacer para ayudar a prevenir la cardiopata coronaria y el ictus:  Debe controlar su presin arterial al menos cada uno o [place]. La hipertensin arterial causa enfermedades cardacas y [place] el riesgo de  ictus.  Si tiene entre 20 y 79aos, consulte al mdico si debe tomar aspirina para prevenir un infarto de miocardio o un ictus.  No consuma ningn producto que contenga tabaco, lo que incluye cigarrillos, tabaco de Higher education careers adviser o Psychologist, sport and exercise. Si necesita ayuda para dejar de fumar, consulte al [employer].  Es importante seguir una dieta sana y Theatre manager un peso saludable.  Asegrese de [employer] Stores verduras, frutas, productos lcteos de bajo contenido de [place] y Advertising account planner.  No consuma alimentos con alto contenido de grasas slidas, azcares agregados o sal (sodio).  Realice actividad fsica con regularidad. Esta es una de las cosas ms importantes que puede hacer por su salud.  Intente realizar al menos 166mnutos de actividad fsica por semana. El tipo de ejercicio que realice debe aumentar la frecuencia cardaca y hacerla sudar. Esto se conoce como ejercicio de i[place]  Intente hacer ejercicios de elongacin por lo menos dos veces por semana. Agrguelos al plan de ejercicio de intensidad moderada.  Conozca sus cifras. Pdale al mdico que le controle el colesterol y el nivel sanguneo de glucosa. Siga hacindose anlisis de sAmerican Electric Powerse lo haya indicado el mdico. QU DEBO SABER SOBRE LAS PRUEBAS DE DETECCIN DEL CNCER? Hay varios tipos de cncer. Tome las siguientes medidas para reducir el riesgo y dActuaryformacin cancerosa lo antes posible. Cncer de mama  Practique la autoconciencia de la mama.  Esto significa reconocer la apariencia normal de sus mamas y cmo las siente.  Tambin significa realizar autoexmenes regulares de lJohnson & Johnson Informe a su mdico sobre cualquier cambio, sin importar cun pequeo sea.  Si es mayor de 40aos, visite a un mdico para qPublic librarianun examen de las mamas (exploracin clnica mamaria o ECM) todos los aMonroe Manor En funcin de sToysRus los antecedentes familiares y la historia cLakeside [place] tal vez sea recomendable  que tambin se haga una radiografa anual de las mamas ([place].  Si tiene antecedentes familiares de cncer de mama, hable con el mdico para someterse a un estudio gentico.  Si tiene alto riesgo de pChief Financial Officerde mama, hable con el mdico para hacerse a uPublic house manager(RM) y uLavinia Sharpstodos los aSalisbury  La evaluacin del gen del cncer de mama (BRCA) se recomienda a las mujeres que tengan familiares con tumores malignos relacionados con el BRCA. Los resultados de la evaluacin determinarn la necesidad de recibir asesoramiento gentico y pBuilding services engineerde deteccin del BRCA1 y el BRCA2. Los tumores malignos relacionados con el BRCA incluyen estos tipos:  [place] Este tipo se presenta en hombres o mujeres.  Ovario.  Trompas. A este tipo tambin se lo llama cncer de trompa de [place].  Cncer de la pared abdominal o plvica (cncer de peritoneo).  Prstata.  Pncreas. Cncer de cuello uterino, de tero y de ovario El mdico puede recomendarle que se haga pruebas peridicas de deteccin de cncer de los rganos de la pelvis, los cuales iVerizonovarios, el tero y la vagina. Estas pruebas incluyen un examen plvico, que abarca controlar si se produjeron cambios microscpicos en la superficie del cuello del tero (  prueba de Papanicolaou).  A las mujeres que [employer] 21 y 30aos, los mdicos pueden recomendarles que se realicen un examen plvico y Ardelia Mems prueba de Papanicolaou cada tres aos. A las mujeres que tienen entre 30 y 65aos, pueden recomendarles la prueba de Papanicolaou y el examen plvico, en combinacin con una prueba de deteccin del virus del papiloma humano (VPH) [place]. Algunos tipos de VPH aumentan el riesgo de Chief Financial Officer de cuello del tero. La prueba para la deteccin del VPH tambin puede realizarse a mujeres de cualquier edad cuyos resultados de la prueba de Papanicolaou no sean claros.  Es posible que otros mdicos no recomienden exmenes  de deteccin a las mujeres no embarazadas que se consideran sujetos de bajo riesgo de Chief Financial Officer de pelvis y no tienen sntomas. Pregntele al mdico si un examen plvico de deteccin es adecuado para usted.  Si ha recibido un tratamiento para Science writer cervical o una enfermedad que podra causar cncer, necesitar realizarse una prueba de Papanicolaou y controles durante al menos 38 aos de concluido el [place]. Si no se ha hecho el Papanicolaou con regularidad, debern volver a evaluarse los factores de riesgo (como tener un nuevo compaero sexual), para Teacher, adult education si debe empezar a Dispensing optician los estudios nuevamente. Algunas mujeres sufren problemas mdicos que aumentan la probabilidad de Museum/gallery curator cncer de cuello del tero. En estos casos, el mdico podr [employer] se realicen controles y pruebas de Papanicolaou con ms frecuencia.  Si tiene antecedentes familiares de cncer de tero o de ovario, hable con el mdico para someterse a un estudio gentico.  Si tiene hemorragia vaginal despus de la menopausia, informe al mdico.  En la actualidad, no hay pruebas confiables para la deteccin del cncer de ovario. Cncer de pulmn Se recomienda realizar exmenes de deteccin de cncer de pulmn a personas adultas entre 33 y 42 aos que estn en riesgo de Horticulturist, commercial de pulmn por sus antecedentes de consumo de tabaco. Se recomienda una tomografa computarizada (TC) de baja dosis de los pulmones todos los aos si usted:  Fuma actualmente.  Ha fumado durante 30aos un paquete diario y sigue fumando o dej el hbito en algn momento en los ltimos 15aos. Un paquete-ao equivale a fumar en promedio un paquete de cigarrillos diario durante un ao. Los exmenes de deteccin anuales:  Deben hacerse hasta que hayan pasado 15aos desde que dej de fumar.  Deben dejar de realizarse si tiene un problema de salud que le impide recibir tratamiento para el cncer de pulmn. Cncer  colorrectal  Este tipo de cncer puede detectarse y a menudo prevenirse.  El estudio de [place] de Programme researcher, broadcasting/film/video del cncer colorrectal debe comenzar a Electrical engineer a Proofreader de los 35aos y [place].  El mdico puede aconsejarle que lo haga antes, si tiene factores de riesgo de Best boy cncer de colon.  Si tiene antecedentes familiares de cncer colorrectal, hable con el mdico para someterse a un estudio gentico.  El mdico tambin puede recomendarle que use un kit de prueba para Engineer, mining a fin de Educational psychologist en la materia fecal.  Es posible que se use una pequea cmara en el extremo de un tubo para examinar directamente el colon (sigmoidoscopia o colonoscopia) a fin de Hydrographic surveyor formas tempranas de cncer colorrectal.  El examen directo del colon se debe repetir cada 5 a 10aos hasta los 27aos. Sin embargo, si se hallan formas incipientes de plipos precancerosos o pequeos tumores, o si tiene antecedentes familiares  o riesgo gentico de Therapist, music, debe realizarse exmenes de deteccin con ms frecuencia. Cncer de piel  Revise la piel de la cabeza a los pies con regularidad.  Contrlese los lunares. Infrmele al mdico:  Si aparecen nuevos lunares o los que tiene se modifican, especialmente en su forma o color.  Si tiene un lunar que es ms grande que el tamao de una goma de Games developer.  Si alguno de los miembros de su familia tiene antecedentes de cncer de piel, especialmente a una edad temprana, hable con el mdico para someterse a pruebas genticas.  Siempre use pantalla solar. Aplique pantalla solar de Kerry Dory y repetida a lo largo del Training and development officer.  Protjase usando mangas y [employer], un sombrero de ala ancha y gafas para el sol, siempre que est al [place]. QU DEBO SABER SOBRE LA OSTEOPOROSIS? La osteoporosis es una afeccin en la cual la destruccin de la masa sea ocurre con mayor rapidez que su formacin. Despus de la menopausia,  puede correr un riesgo ms alto de tener osteoporosis. Para ayudar a prevenir esta afeccin o las fracturas seas que pueden ocurrir a causa de [place], se recomienda lo siguiente:  Si tiene entre 19 y 50aos, tome como mnimo 1033m de calcio y 6098mde vitaminaD por daTraining and development officer Si es mayor de 50aos pero menor de 70aos, tome como mnimo 120052me calcio y 600m28m vitaminaD por da. Training and development officeri es mayor de 70aos, tome como mnimo 1200mg70mcalcio y 800mg 69mitaminaD por da. ElTraining and development officerabaquismo y el consumo excesivo de alcohol aumentan el riesgo de osteoporosis. Consuma alimentos ricos en calcio y vitaminaD, y haga ejercicios con soporte de peso varias veces a la semana, como se lo haya indicado el mdico. QU DEBO SABER SOBRE EL MODO EN QUE LA MENOPAUSIA AFECTA MI SALUD MENTAL? La depresin puede presentarse a cualquier edad, pero es ms frecuente a medida que una persona envejece. Los sntomas comunes de depresin incluyen lo siguiente:  Desnimo o tristeza.  Cambios en los patrones de sueo.  Cambios en el apetito o en los hbitos de alimentacin.  Sensacin de falta general de motivacin o placer al realizYahooidades que sola disfrutar.  Crisis frecuentes de llanto. Hable con el mdico si cree que tiene depresin. QU DEBO SABER SOBRE LAS VACUNAS? Es importante que se aplique las vacunas y las maSaronvilles incluyen las siguientes:  Vacuna contra el ttanos, la difteria y la tosferina (Tdap).  Vacuna anual contra la gripe antes del inicio de la temporada de gripe.  Vacuna contra la neumona.  Vacuna contra el herpes. El mdico tambin puede recomendarle que se aplique otras vacunas. Esta informacin no tiene como fMarine scientistnsejo del mdico. Asegrese de hacerle al mdico cualquier pregunta que tenga. Document Released: 01/12/2013 Document Revised: 04/14/2014 Document Reviewed: 12/26/2014 Elsevier Interactive Patient Education  2017 [employer]

## 2016-07-04 ENCOUNTER — Ambulatory Visit (HOSPITAL_COMMUNITY): Payer: No Typology Code available for payment source

## 2016-07-04 DIAGNOSIS — N898 Other specified noninflammatory disorders of vagina: Secondary | ICD-10-CM | POA: Insufficient documentation

## 2016-07-04 DIAGNOSIS — N951 Menopausal and female climacteric states: Secondary | ICD-10-CM | POA: Insufficient documentation

## 2016-08-18 ENCOUNTER — Other Ambulatory Visit: Payer: Self-pay | Admitting: Obstetrics and Gynecology

## 2016-08-18 DIAGNOSIS — Z1231 Encounter for screening mammogram for malignant neoplasm of breast: Secondary | ICD-10-CM

## 2016-08-28 ENCOUNTER — Ambulatory Visit (HOSPITAL_COMMUNITY)
Admission: RE | Admit: 2016-08-28 | Discharge: 2016-08-28 | Disposition: A | Discharge status: Home / Self Care | Payer: Self-pay | Source: Ambulatory Visit | Attending: Obstetrics and Gynecology | Admitting: Obstetrics and Gynecology

## 2016-08-28 ENCOUNTER — Encounter (HOSPITAL_COMMUNITY): Payer: Self-pay

## 2016-08-28 ENCOUNTER — Ambulatory Visit
Admission: RE | Admit: 2016-08-28 | Discharge: 2016-08-28 | Disposition: A | Discharge status: Home / Self Care | Payer: No Typology Code available for payment source | Source: Ambulatory Visit | Attending: Obstetrics and Gynecology | Admitting: Obstetrics and Gynecology

## 2016-08-28 VITALS — BP 184/102 | Temp 98.2°F | Ht 64.0 in | Wt 178.8 lb

## 2016-08-28 DIAGNOSIS — Z1231 Encounter for screening mammogram for malignant neoplasm of breast: Secondary | ICD-10-CM

## 2016-08-28 DIAGNOSIS — Z1239 Encounter for other screening for malignant neoplasm of breast: Secondary | ICD-10-CM

## 2016-08-28 NOTE — Progress Notes (Signed)
No complaints today.   Pap Smear: Pap smear not completed today. Last Pap smear was 06/23/2016 at the [facility] and normal with negative HPV. Per patient has no history of an abnormal Pap smear. Last Pap smear result is in EPIC.  Physical exam: Breasts Breasts symmetrical. No skin abnormalities bilateral breasts. No nipple retraction bilateral breasts. No nipple discharge bilateral breasts. No lymphadenopathy. No lumps palpated bilateral breasts. No complaints of pain or tenderness on exam. Referred patient to the [facility] for a screening mammogram. Appointment scheduled for Thursday, Aug 28, 2016 at 1510.        Pelvic/Bimanual No Pap smear completed today since last Pap smear and HPV typing was 06/23/2016. Pap smear not indicated per BCCCP guidelines.   Smoking History: Patient has never smoked.  Patient Navigation: Patient education provided. Access to services provided for patient through [facility] program. Spanish interpreter provided.  Colorectal Cancer Screening: Per patient has never had a colonoscopy completed. No complaints today. FIT Test given to patient to complete and return to BCCCP.  Used Spanish interpreter [employer] from [facility].

## 2016-08-28 NOTE — Patient Instructions (Signed)
Explained breast self awareness with [facility] [place]. Patient did not need a Pap smear today due to last Pap smear and HPV typing was 06/23/2016. Let her know BCCCP will cover Pap smears and HPV typing every 5 years unless has a history of abnormal Pap smears. Referred patient to the [facility] for a screening mammogram. Appointment scheduled for Thursday, Aug 28, 2016 at 1510. [facility] know [facility] will follow up with her within the next couple weeks with results of mammogram by letter or phone. Laura Richardson verbalized understanding.  [place]annock, Arvil Chaco, RN 2:56 PM

## 2016-08-29 ENCOUNTER — Other Ambulatory Visit: Payer: Self-pay

## 2016-09-02 ENCOUNTER — Encounter (HOSPITAL_COMMUNITY): Payer: Self-pay | Admitting: *Deleted

## 2016-09-03 LAB — FECAL OCCULT BLOOD, IMMUNOCHEMICAL: FECAL OCCULT BLD: NEGATIVE

## 2016-09-23 ENCOUNTER — Ambulatory Visit: Payer: No Typology Code available for payment source | Admitting: Family Medicine

## 2016-09-24 ENCOUNTER — Encounter (HOSPITAL_COMMUNITY): Payer: Self-pay | Admitting: *Deleted

## 2016-09-24 NOTE — Progress Notes (Signed)
Letter mailed to patient with negative Fit Test results.  

## 2016-09-30 ENCOUNTER — Ambulatory Visit (INDEPENDENT_AMBULATORY_CARE_PROVIDER_SITE_OTHER): Payer: Self-pay | Admitting: Family Medicine

## 2016-09-30 ENCOUNTER — Encounter: Payer: Self-pay | Admitting: Family Medicine

## 2016-09-30 VITALS — BP 136/60 | HR 80 | Temp 98.2°F | Resp 16 | Ht 64.0 in | Wt 177.0 lb

## 2016-09-30 DIAGNOSIS — I10 Essential (primary) hypertension: Secondary | ICD-10-CM

## 2016-09-30 DIAGNOSIS — R7303 Prediabetes: Secondary | ICD-10-CM

## 2016-09-30 DIAGNOSIS — M1990 Unspecified osteoarthritis, unspecified site: Secondary | ICD-10-CM

## 2016-09-30 LAB — CBC WITH DIFFERENTIAL/PLATELET
BASOS PCT: 0 %
Basophils Absolute: 0 cells/uL (ref 0–200)
EOS PCT: 11 %
Eosinophils Absolute: 1177 cells/uL — ABNORMAL HIGH (ref 15–500)
HCT: 45.3 % — ABNORMAL HIGH (ref 35.0–45.0)
Hemoglobin: 14.5 g/dL (ref 11.7–15.5)
Lymphocytes Relative: 41 %
Lymphs Abs: 4387 cells/uL — ABNORMAL HIGH (ref 850–3900)
MCH: 25.8 pg — ABNORMAL LOW (ref 27.0–33.0)
MCHC: 32 g/dL (ref 32.0–36.0)
MCV: 80.7 fL (ref 80.0–100.0)
MONOS PCT: 5 %
MPV: 10.5 fL (ref 7.5–12.5)
Monocytes Absolute: 535 cells/uL (ref 200–950)
NEUTROS ABS: 4601 {cells}/uL (ref 1500–7800)
Neutrophils Relative %: 43 %
PLATELETS: 256 10*3/uL (ref 140–400)
RBC: 5.61 MIL/uL — AB (ref 3.80–5.10)
RDW: 13.9 % (ref 11.0–15.0)
WBC: 10.7 10*3/uL (ref 3.8–10.8)

## 2016-09-30 LAB — POCT URINALYSIS DIP (DEVICE)
BILIRUBIN URINE: NEGATIVE
GLUCOSE, UA: NEGATIVE mg/dL
Hgb urine dipstick: NEGATIVE
KETONES UR: NEGATIVE mg/dL
Leukocytes, UA: NEGATIVE
Nitrite: NEGATIVE
Protein, ur: NEGATIVE mg/dL
SPECIFIC GRAVITY, URINE: 1.02 (ref 1.005–1.030)
Urobilinogen, UA: 0.2 mg/dL (ref 0.0–1.0)
pH: 7 (ref 5.0–8.0)

## 2016-09-30 LAB — POCT GLYCOSYLATED HEMOGLOBIN (HGB A1C): HEMOGLOBIN A1C: 6.4

## 2016-09-30 NOTE — Patient Instructions (Addendum)
Artritis: tome Tylenol 500 mg cada 6 horas segn sea necesario  Prediabetes: Continuar con metformina 500 mg con desayuno. Recomiende una dieta baja en carbohidratos bajo en grasa, dividida sobre 5-6 pequeas comidas, aumente la ingesta de agua a 6-8 vasos, y 150 minutos por semana de ejercicio cardiovascular.    Hipertensin: Continuar Novasc 5 mg al da.  Compruebe la presin sangunea en AM   La diabetes mellitus y los alimentos (Diabetes Mellitus and Food) Es importante que controle su nivel de azcar en la sangre (glucosa). El nivel de glucosa en sangre depende en gran medida de lo que usted come. Comer alimentos saludables en las cantidades [place] a lo largo del Training and development officer, aproximadamente a la misma hora [employer], lo ayudar a Chief Technology Officer su nivel de Multimedia programmer. Tambin puede ayudarlo a retrasar o Patent attorney de la diabetes mellitus. Comer de [employer] saludable incluso puede ayudarlo a Chartered loss adjuster de presin arterial y a Science writer o Theatre manager un peso saludable. Entre las recomendaciones generales para alimentarse y Audiological scientist los alimentos de forma saludable, se incluyen las siguientes:  Respetar las comidas principales y comer colaciones con regularidad. Evitar pasar largos perodos sin comer con el fin de perder peso.  Seguir una dieta que consista principalmente en alimentos de origen vegetal, como frutas, vegetales, frutos secos, legumbres y cereales integrales.  Utilizar mtodos de coccin a baja temperatura, como hornear, en lugar de mtodos de coccin a alta temperatura, como frer en abundante aceite. Trabaje con el nutricionista para aprender a Financial planner nutricional de las etiquetas de los alimentos. CMO PUEDEN AFECTARME LOS ALIMENTOS? Carbohidratos Los carbohidratos afectan el nivel de glucosa en sangre ms que cualquier otro tipo de alimento. El nutricionista lo ayudar a Teacher, adult education cuntos carbohidratos puede consumir en cada comida y ensearle  a contarlos. El recuento de carbohidratos es importante para mantener la glucosa en sangre en un nivel saludable, en especial si utiliza insulina o toma determinados medicamentos para la diabetes mellitus. Alcohol El alcohol puede provocar disminuciones sbitas de la glucosa en sangre (hipoglucemia), en especial si utiliza insulina o toma determinados medicamentos para la diabetes mellitus. La hipoglucemia es una afeccin que puede poner en peligro la vida. Los sntomas de la hipoglucemia (somnolencia, mareos y Data processing manager) son similares a los sntomas de haber consumido mucho alcohol. Si el mdico lo autoriza a beber alcohol, hgalo con moderacin y siga estas pautas:  Las mujeres no deben beber ms de un trago por da, y los hombres no deben beber ms de dos tragos por Training and development officer. Un trago es igual a: ? 12 onzas (355 ml) de cerveza ? 5 onzas de vino (150 ml) de vino ? 1,5onzas (80ml) de bebidas espirituosas  No beba con el estmago vaco.  Mantngase hidratado. Beba agua, gaseosas dietticas o t helado sin azcar.  Las gaseosas comunes, los jugos y otros refrescos podran contener muchos carbohidratos y se Civil Service fast streamer. QU ALIMENTOS NO SE RECOMIENDAN? Cuando haga las elecciones de alimentos, es importante que recuerde que todos los alimentos son distintos. Algunos tienen menos nutrientes que otros por porcin, aunque podran tener la misma cantidad de caloras o carbohidratos. Es difcil darle al cuerpo lo que necesita cuando consume alimentos con menos nutrientes. Estos son algunos ejemplos de alimentos que debera evitar ya que contienen muchas caloras y carbohidratos, pero pocos nutrientes:  Physicist, medical trans (la mayora de los alimentos procesados incluyen grasas trans en la etiqueta de Informacin nutricional).  Gaseosas comunes.  Jugos.  Caramelos.  Dulces, como  tortas, pasteles, rosquillas y galletas.  Comidas fritas. QU ALIMENTOS PUEDO COMER? Consuma alimentos ricos en  nutrientes, que nutrirn el cuerpo y lo mantendrn saludable. Los alimentos que debe comer tambin dependern de varios factores, como:  Las caloras que necesita.  Los medicamentos que toma.  Su peso.  El nivel de glucosa en [place].  El [place] de presin arterial.  El nivel de colesterol. Debe consumir una amplia variedad de alimentos, por ejemplo:  Protenas. ? Cortes de [employer]. ? Protenas con bajo contenido de grasas saturadas, como pescado, clara de huevo y frijoles. Evite las carnes procesadas.  Frutas y vegetales. ? [place] y Photographer que pueden ayudar a [employer] niveles sanguneos de [place], como [place], mangos y batatas.  Productos lcteos. ? Elija productos lcteos sin grasa o con bajo contenido de [place], como [place], yogur y [place].  Cereales, panes, pastas y arroz. ? Elija cereales integrales, como panes multicereales, avena en grano y arroz integral. Estos alimentos pueden ayudar a controlar la presin arterial.  Daphene Jaeger. ? Alimentos que contengan grasas saludables, como frutos secos, Musician, aceite de [place], aceite de canola y pescado. TODOS LOS QUE PADECEN DIABETES MELLITUS TIENEN EL [place] PLAN DE [place]? Dado que todas las personas que padecen diabetes mellitus son distintas, no hay un solo plan de comidas que funcione para todos. Es muy importante que se rena con un nutricionista que lo ayudar a crear un plan de comidas adecuado para usted. Esta informacin no tiene Marine scientist el consejo del mdico. Asegrese de hacerle al mdico cualquier pregunta que tenga. Document Released: 07/01/2007 Document Revised: 04/14/2014 Document Reviewed: 02/18/2013 Elsevier Interactive Patient Education  2017 [employer].

## 2016-09-30 NOTE — Progress Notes (Signed)
Subjective:    Patient ID: Laura Richardson, female    DOB: January 19, 1956, 61 y.o.   MRN: [id number]  HPI Ms. Laura Richardson, a  61 year old female with a history of prediabetes and hypertension presents for a 3 month follow up.   Hypertension:  Laura Richardson has a history of hypertension. She says that blood pressures are controlled at home. She is not exercising and is not adherent to low salt diet.  She does not check blood pressure at home.Patient denies chest pain, chest pressure/discomfort, dyspnea, fatigue, near-syncope, orthopnea and tachypnea.  Cardiovascular risk factors include prediabetes and a sedentary lifestyle  Pre-Diabetes:  Patient also has a history of pre diabetes. Previous hemoglobin a1C was 6.3.She does not follow a carbohydrate modified diet. Symptoms of diabetes include polyuria. She also endorses urinary frequency and dysuria. She denies  foot ulcerations, paresthesia of the feet, polydipsia, visual disturbances, vomitting and weight loss.    Past Medical History:  Diagnosis Date  . Cough   . Diabetes mellitus without complication ([place])   . Gout   . HTN (hypertension)    Social History   Social History Narrative  . No narrative on file   There is no immunization history on file for this patient.  No Known Allergies  Review of Systems  Constitutional: Negative.   HENT: Negative.   Eyes: Negative.   Respiratory: Negative.   Cardiovascular: Negative.   Gastrointestinal: Negative.   Endocrine: Negative.   Musculoskeletal: Positive for arthralgias.  Skin: Negative.   Neurological: Negative.   Hematological: Negative.   Psychiatric/Behavioral: Negative.        Objective:   Physical Exam  Constitutional: She is oriented to person, place, and time. She appears well-developed and well-nourished.  HENT:  Head: Normocephalic and atraumatic.  Right Ear: External ear normal.  Left Ear: External ear normal.  Nose: Nose normal.  Mouth/Throat: Oropharynx  is clear and moist.  Eyes: Conjunctivae and EOM are normal. Pupils are equal, round, and reactive to light.  Neck: Normal range of motion. Neck supple.  Cardiovascular: Normal rate, regular rhythm, normal heart sounds and intact distal pulses.   Pulmonary/Chest: Effort normal and breath sounds normal.  Abdominal: Soft. Bowel sounds are normal.  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 She has normal reflexes.  Skin: Skin is warm and dry.  Psychiatric: She has a normal mood and affect. Her behavior is normal. Judgment and thought content normal.   BP 136/60 (BP Location: Left Arm, Patient Position: Sitting, Cuff Size: Normal) Comment: manually  Pulse 80   Temp 98.2 F (36.8 C) (Oral)   Resp 16   Ht 5\' 4"  (1.626 m)   Wt 177 lb (80.3 kg)   SpO2 99%   BMI 30.38 kg/m     Assessment & Plan:  1. Prediabetes Hemoglobin a1C is 6.4. Discussed carbohydrate diet and exercise at length. Discussed the importance of watching portion sizes and increasing water intake. Patient expressed understanding.  - HgB A1c - POCT urinalysis dip (device)  2. Essential hypertension Blood pressure is at goal on current medication regimen.  - COMPLETE METABOLIC PANEL WITH GFR - CBC with Differential - POCT urinalysis dip (device)  3. Arthritis Recommend Tylenol 500 mg every 6 hours as needed for arthritis pain.   4. Language barrier to communication  Laura Richardson primarily speaks spanish, daughter interpreting   RTC: Follow up in 3 months for chronic  conditions   Donia Pounds  MSN, FNP-C [place] [address] [place], [place] [address] [phone]

## 2016-10-01 LAB — COMPLETE METABOLIC PANEL WITH GFR
ALBUMIN: 4.2 g/dL (ref 3.6–5.1)
ALK PHOS: 107 U/L (ref 33–130)
ALT: 24 U/L (ref 6–29)
AST: 21 U/L (ref 10–35)
BILIRUBIN TOTAL: 0.3 mg/dL (ref 0.2–1.2)
BUN: 14 mg/dL (ref 7–25)
CO2: 23 mmol/L (ref 20–31)
Calcium: 9.3 mg/dL (ref 8.6–10.4)
Chloride: 103 mmol/L (ref 98–110)
Creat: 0.83 mg/dL (ref 0.50–0.99)
GFR, EST AFRICAN AMERICAN: 88 mL/min (ref >=60)
GFR, EST NON AFRICAN AMERICAN: 76 mL/min (ref >=60)
Glucose, Bld: 132 mg/dL — ABNORMAL HIGH (ref 65–99)
Potassium: 4.1 mmol/L (ref 3.5–5.3)
Sodium: 140 mmol/L (ref 135–146)
TOTAL PROTEIN: 7 g/dL (ref 6.1–8.1)

## 2016-11-01 ENCOUNTER — Other Ambulatory Visit: Payer: Self-pay | Admitting: Family Medicine

## 2016-11-01 DIAGNOSIS — I10 Essential (primary) hypertension: Secondary | ICD-10-CM

## 2017-01-01 ENCOUNTER — Ambulatory Visit (INDEPENDENT_AMBULATORY_CARE_PROVIDER_SITE_OTHER): Payer: No Typology Code available for payment source | Admitting: Family Medicine

## 2017-01-01 ENCOUNTER — Encounter: Payer: Self-pay | Admitting: Family Medicine

## 2017-01-01 VITALS — BP 130/74 | HR 82 | Temp 98.7°F | Resp 14 | Ht 64.0 in | Wt 181.0 lb

## 2017-01-01 DIAGNOSIS — H5713 Ocular pain, bilateral: Secondary | ICD-10-CM

## 2017-01-01 DIAGNOSIS — R7303 Prediabetes: Secondary | ICD-10-CM

## 2017-01-01 DIAGNOSIS — I10 Essential (primary) hypertension: Secondary | ICD-10-CM

## 2017-01-01 LAB — POCT URINALYSIS DIP (DEVICE)
Bilirubin Urine: NEGATIVE
GLUCOSE, UA: NEGATIVE mg/dL
Hgb urine dipstick: NEGATIVE
Ketones, ur: NEGATIVE mg/dL
Leukocytes, UA: NEGATIVE
Nitrite: NEGATIVE
PH: 7.5 (ref 5.0–8.0)
PROTEIN: NEGATIVE mg/dL
Specific Gravity, Urine: 1.02 (ref 1.005–1.030)
UROBILINOGEN UA: 0.2 mg/dL (ref 0.0–1.0)

## 2017-01-01 LAB — POCT GLYCOSYLATED HEMOGLOBIN (HGB A1C): HEMOGLOBIN A1C: 6.5

## 2017-01-01 MED ORDER — CLONIDINE HCL 0.1 MG PO TABS: 0.1000 mg | ORAL_TABLET | Freq: Three times a day (TID) | ORAL | 3 refills | Status: DC

## 2017-01-01 MED ORDER — CLONIDINE HCL 0.1 MG PO TABS
0.2000 mg | ORAL_TABLET | Freq: Once | ORAL | Status: AC
  Administered 2017-01-01: 0.2 mg via ORAL

## 2017-01-01 NOTE — Progress Notes (Signed)
Patient ID: Laura Richardson, female    DOB: 06/26/1955, 61 y.o.   MRN: [id number]  PCP: Dorena Dew, FNP  Chief Complaint  Patient presents with  . Follow-up    3 MONTH    Subjective:  HPI Laura Richardson is a 61 y.o. female presents for routine 3 month follow-up of chronic disease management. Laura Richardson suffers from hypertension and prediabetes. She is accompanied today by family member who is assisting with translation today. Reports concern regarding eye pain. She had an eye exam greater than 1 year ago and was told that she needed corrective lenses although, never obtain. She reports sensitivity to light and occasional headaches.  Laura Richardson suffers from hypertension although reports no home monitoring of blood pressure. Denies side effects of medication and reports no chest pain , dizziness, or shortness of breath. She is mostly sedentary and reports no routine physical activity. Laura Richardson is a prediabetic, prescribed metformin and reports tolerating medication. Last A1C 6.4 Current Body mass index is 31.07 kg/m. Social History   Social History  . Marital status: Married    Spouse name: N/A  . Number of children: N/A  . Years of education: N/A   Occupational History  . unemployed    Social History Main Topics  . Smoking status: Never Smoker  . Smokeless tobacco: Never Used  . Alcohol use No  . Drug use: No  . Sexual activity: Not on file   Other Topics Concern  . Not on file   Social History Narrative  . No narrative on file    Family History  Problem Relation Age of Onset  . Diabetes Mother   . Hypertension Mother   . Diabetes Father   . Hypertension Father   . Breast cancer Sister    Review of Systems  Constitutional: Negative.   HENT: Negative.   Eyes: Positive for photophobia and pain. Negative for discharge.  Respiratory: Negative.   Cardiovascular: Negative.   Neurological: Positive for headaches. Negative for dizziness.   See HPI  Patient Active  Problem List   Diagnosis Date Noted  . Vaginal dryness 07/04/2016  . Hot flashes due to menopause 07/04/2016  . Pelvic pain in female 06/29/2016  . Prediabetes 04/25/2016  . Elevated blood pressure reading 02/15/2016  . Chronic cough 10/18/2013    No Known Allergies  Prior to Admission medications   Medication Sig Start Date End Date Taking? Authorizing Provider  amLODipine (NORVASC) 5 MG tablet TAKE ONE TABLET BY MOUTH ONCE DAILY 11/03/16  Yes Dorena Dew, FNP  metFORMIN (GLUCOPHAGE XR) 500 MG 24 hr tablet Take 1 tablet (500 mg total) by mouth daily with breakfast. 06/23/16  Yes Dorena Dew, FNP  Vaginal Lubricant (REPLENS) GEL Place 1 each vaginally at bedtime. Patient not taking: Reported on 01/01/2017 07/03/16   Dorena Dew, FNP    Past Medical, Surgical Family and Social History reviewed and updated.    Objective:   Vitals:   01/01/17 1107  BP: 130/74  Pulse: 82  Resp: 14  Temp: 98.7 F (37.1 C)  SpO2: 97%     Wt Readings from Last 3 Encounters:  01/01/17 181 lb (82.1 kg)  09/30/16 177 lb (80.3 kg)  08/28/16 178 lb 12.8 oz (81.1 kg)   Physical Exam  Constitutional: She is oriented to person, place, and time. She appears well-developed and well-nourished.  HENT:  Head: Normocephalic and atraumatic.  Mouth/Throat: Oropharynx is clear and moist.  Eyes: Pupils are equal, round, and reactive to  light. Conjunctivae and EOM are normal. No scleral icterus.  Neck: Normal range of motion. Neck supple.  Cardiovascular: Normal rate, regular rhythm, normal heart sounds and intact distal pulses.   Pulmonary/Chest: Effort normal and breath sounds normal.  Neurological: She is alert and oriented to person, place, and time.  Skin: Skin is warm and dry.  Psychiatric: She has a normal mood and affect. Her behavior is normal. Judgment and thought content normal.   Assessment & Plan:  1. Essential hypertension, continue amlodipine 5 mg once daily. No changes in  medication. 2. Diabetes, type 2  HgB A1C-6.5 today, continue metformin as prescribed. Increase physical activity as tolerated. As discussed, increase water intake. Educated about effects of sugar  beverages on worsening glycemic control. 3. Pain of both eyes, obtain an eye exam. Ocular exam is negative for redness, exudate, or abnormal ocular movement.  RTC: 3 months hypertension and diabetes   Carroll Sage. Kenton Kingfisher, MSN, FNP-C [facility]  [address] [place], [place] [address] [phone]   .

## 2017-01-01 NOTE — Patient Instructions (Addendum)
Contact [facility] to schedule an diabetic eye and glaucoma [phone]  I am not making any changes to your medications today.    I encourage walking at minium 15-20 minutes 5-7 days per week to improve glycemic control and blood pressure.     Hipertensin Hypertension El trmino hipertensin es otra forma de denominar a la presin arterial elevada. La presin arterial elevada fuerza al corazn a trabajar ms para bombear la sangre. Esto puede causar problemas con el paso del [place]. Una lectura de presin arterial est compuesta por 2 nmeros. Hay un nmero superior (sistlico) sobre un nmero inferior (diastlico). Lo ideal es tener la presin arterial por debajo de 120/80. Las decisiones saludables pueden ayudarle a disminuir su presin arterial. Es posible que necesite medicamentos que le ayuden a disminuir su presin arterial si:  Su presin arterial no disminuye mediante decisiones saludables.  Su presin arterial est por encima de 130/80.  Siga estas instrucciones en su casa: Comida y bebida  Si se lo indican, siga el plan de alimentacin de DASH (Dietary Approaches to Stop Hypertension, Maneras de alimentarse para detener la hipertensin). Esta dieta incluye: ? Que la mitad del plato de cada comida sea de frutas y verduras. ? Que un cuarto del plato de cada comida sea de cereales integrales. Los cereales integrales incluyen pasta integral, arroz integral y pan integral. ? Comer y beber productos lcteos con bajo contenido de [place], como leche descremada o yogur bajo en grasas. ? Que un cuarto del plato de cada comida sea de protenas bajas en grasa (magras). Las protenas bajas en grasa incluyen pescado, pollo sin piel, huevos, frijoles y tofu. ? Evitar consumir carne grasa, carne curada y procesada, o pollo con piel. ? Evitar consumir alimentos prehechos o procesados.  Consuma menos de 1500 mg de sal (sodio) por da.  Limite el consumo de alcohol a  no ms de 1 medida por da si es mujer y no est Music therapist y a 2 medidas por da si es hombre. Una medida equivale a 12onzas de cerveza, 5onzas de vino o 1onzas de bebidas alcohlicas de alta graduacin. Estilo de vida  Trabaje con su mdico para mantenerse en un peso saludable o para perder peso. Pregntele a su mdico cul es el peso recomendable para usted.  Realice al menos 30 minutos de ejercicio que haga que se acelere su corazn (ejercicio Arboriculturist) la [employer] de la [place]. Estos pueden incluir caminar, nadar o andar en bicicleta.  Realice al menos 30 minutos de ejercicio que fortalezca sus msculos (ejercicios de resistencia) al menos 3 das a la [place]. Estos pueden incluir levantar pesas o hacer pilates.  No consuma ningn producto que contenga nicotina o tabaco. Esto incluye cigarrillos y cigarrillos electrnicos. Si necesita ayuda para dejar de fumar, consulte al [employer].  Controle su presin arterial en su casa tal como le indic el mdico.  Concurra a todas las visitas de control como se lo haya indicado el mdico. Esto es importante. Medicamentos  [employer] de venta libre y los recetados solamente como se lo haya indicado el mdico. Siga cuidadosamente las indicaciones.  No omita las dosis de medicamentos para la presin arterial. Los medicamentos pierden eficacia si omite dosis. El hecho de omitir las dosis tambin [place] el riesgo de otros problemas.  Pregntele a su mdico a qu efectos secundarios o reacciones a los Careers information officer. Comunquese con un mdico si:  Piensa que tiene [place] reaccin a los  medicamentos que est tomando.  Tiene dolores de cabeza frecuentes (recurrentes).  Siente mareos.  Tiene hinchazn en los tobillos.  Tiene problemas de visin. Solicite ayuda de inmediato si:  Siente un dolor de cabeza muy intenso.  Comienza a sentirse confundido.  Se siente dbil o adormecido.  Siente que va a  desmayarse.  Siente un dolor muy intenso en: ? El pecho. ? El vientre (abdomen).  Devuelve (vomita) ms de una vez.  Tiene dificultad para respirar. Resumen  El trmino hipertensin es otra forma de denominar a la presin arterial elevada.  Las decisiones saludables pueden ayudarle a disminuir su presin arterial. Si no puede controlar su presin arterial mediante decisiones saludables, es posible que deba tomar medicamentos. Esta informacin no tiene Marine scientist el consejo del mdico. Asegrese de hacerle al mdico cualquier pregunta que tenga. Document Released: 09/11/2009 Document Revised: 03/05/2016 Document Reviewed: 03/05/2016 Elsevier Interactive Patient Education  2018 Neponset diabetes mellitus y los alimentos (Diabetes Mellitus and Food) Es importante que controle su nivel de azcar en la sangre (glucosa). El nivel de glucosa en sangre depende en gran medida de lo que usted come. Comer alimentos saludables en las cantidades [place] a lo largo del Training and development officer, aproximadamente a la misma hora [employer], lo ayudar a Chief Technology Officer su nivel de Multimedia programmer. Tambin puede ayudarlo a retrasar o Patent attorney de la diabetes mellitus. Comer de [employer] saludable incluso puede ayudarlo a Chartered loss adjuster de presin arterial y a Science writer o Theatre manager un peso saludable. Entre las recomendaciones generales para alimentarse y Audiological scientist los alimentos de forma saludable, se incluyen las siguientes:  Respetar las comidas principales y comer colaciones con regularidad. Evitar pasar largos perodos sin comer con el fin de perder peso.  Seguir una dieta que consista principalmente en alimentos de origen vegetal, como frutas, vegetales, frutos secos, legumbres y cereales integrales.  Utilizar mtodos de coccin a baja temperatura, como hornear, en lugar de mtodos de coccin a alta temperatura, como frer en abundante aceite. Trabaje con el nutricionista para aprender a  Financial planner nutricional de las etiquetas de los alimentos. CMO PUEDEN AFECTARME LOS ALIMENTOS? Carbohidratos Los carbohidratos afectan el nivel de glucosa en sangre ms que cualquier otro tipo de alimento. El nutricionista lo ayudar a Teacher, adult education cuntos carbohidratos puede consumir en cada comida y ensearle a contarlos. El recuento de carbohidratos es importante para mantener la glucosa en sangre en un nivel saludable, en especial si utiliza insulina o toma determinados medicamentos para la diabetes mellitus. Alcohol El alcohol puede provocar disminuciones sbitas de la glucosa en sangre (hipoglucemia), en especial si utiliza insulina o toma determinados medicamentos para la diabetes mellitus. La hipoglucemia es una afeccin que puede poner en peligro la vida. Los sntomas de la hipoglucemia (somnolencia, mareos y Data processing manager) son similares a los sntomas de haber consumido mucho alcohol. Si el mdico lo autoriza a beber alcohol, hgalo con moderacin y siga estas pautas:  Las mujeres no deben beber ms de un trago por da, y los hombres no deben beber ms de dos tragos por Training and development officer. Un trago es igual a: ? 12 onzas (355 ml) de cerveza ? 5 onzas de vino (150 ml) de vino ? 1,5onzas (32ml) de bebidas espirituosas  No beba con el estmago vaco.  Mantngase hidratado. Beba agua, gaseosas dietticas o t helado sin azcar.  Las gaseosas comunes, los jugos y otros refrescos podran contener muchos carbohidratos y se Civil Service fast streamer. QU ALIMENTOS NO SE RECOMIENDAN?  Cuando haga las elecciones de alimentos, es importante que recuerde que todos los alimentos son distintos. Algunos tienen menos nutrientes que otros por porcin, aunque podran tener la misma cantidad de caloras o carbohidratos. Es difcil darle al cuerpo lo que necesita cuando consume alimentos con menos nutrientes. Estos son algunos ejemplos de alimentos que debera evitar ya que contienen muchas caloras y carbohidratos, pero  pocos nutrientes:  Physicist, medical trans (la mayora de los alimentos procesados incluyen grasas trans en la etiqueta de Informacin nutricional).  Gaseosas comunes.  Jugos.  Caramelos.  Dulces, como tortas, pasteles, rosquillas y [place].  Comidas fritas. QU ALIMENTOS PUEDO COMER? Consuma alimentos ricos en nutrientes, que nutrirn el cuerpo y lo mantendrn saludable. Los alimentos que debe comer tambin dependern de varios factores, como:  Las caloras que necesita.  Los medicamentos que toma.  Su peso.  El nivel de glucosa en [place].  El [place] de presin arterial.  El nivel de colesterol. Debe consumir una amplia variedad de alimentos, por ejemplo:  Protenas. ? Cortes de [employer]. ? Protenas con bajo contenido de grasas saturadas, como pescado, clara de huevo y frijoles. Evite las carnes procesadas.  Frutas y vegetales. ? [place] y Photographer que pueden ayudar a [employer] niveles sanguneos de [place], como [place], mangos y batatas.  Productos lcteos. ? Elija productos lcteos sin grasa o con bajo contenido de [place], como [place], yogur y [place].  Cereales, panes, pastas y arroz. ? Elija cereales integrales, como panes multicereales, avena en grano y arroz integral. Estos alimentos pueden ayudar a controlar la presin arterial.  Daphene Jaeger. ? Alimentos que contengan grasas saludables, como frutos secos, Musician, aceite de [place], aceite de can[place] y pescado. TODOS LOS QUE PADECEN DIABETES MELLITUS TIENEN EL [place] PLAN DE [place]? Dado que todas las personas que padecen diabetes mellitus son distintas, no hay un solo plan de comidas que funcione para todos. Es muy importante que se rena con un nutricionista que lo ayudar a crear un plan de comidas adecuado para usted. Esta informacin no tiene Marine scientist el consejo del mdico. Asegrese de hacerle al mdico cualquier pregunta que tenga. Document Released: 07/01/2007 Document Revised: 04/14/2014 Document  Reviewed: 02/18/2013 Elsevier Interactive Patient Education  2017 [employer].

## 2017-01-05 ENCOUNTER — Telehealth: Payer: Self-pay

## 2017-01-05 NOTE — Telephone Encounter (Signed)
Please contact patient to advise no blood pressure medication change was made at her last visit. This is also documented on her after summary visit.  Carroll Sage. Kenton Kingfisher, MSN, FNP-C [facility]  [address] Barbara Cower [place], [place] [address] [phone]

## 2017-04-10 ENCOUNTER — Ambulatory Visit: Payer: No Typology Code available for payment source | Admitting: Family Medicine

## 2017-04-13 ENCOUNTER — Encounter: Payer: Self-pay | Admitting: Family Medicine

## 2017-04-13 ENCOUNTER — Ambulatory Visit (INDEPENDENT_AMBULATORY_CARE_PROVIDER_SITE_OTHER): Payer: No Typology Code available for payment source | Admitting: Family Medicine

## 2017-04-13 VITALS — BP 132/82 | HR 82 | Temp 97.9°F | Resp 14 | Ht 64.0 in | Wt 176.0 lb

## 2017-04-13 DIAGNOSIS — M1990 Unspecified osteoarthritis, unspecified site: Secondary | ICD-10-CM

## 2017-04-13 DIAGNOSIS — L0231 Cutaneous abscess of buttock: Secondary | ICD-10-CM

## 2017-04-13 DIAGNOSIS — R7303 Prediabetes: Secondary | ICD-10-CM

## 2017-04-13 DIAGNOSIS — E119 Type 2 diabetes mellitus without complications: Secondary | ICD-10-CM

## 2017-04-13 DIAGNOSIS — I10 Essential (primary) hypertension: Secondary | ICD-10-CM

## 2017-04-13 DIAGNOSIS — Z789 Other specified health status: Secondary | ICD-10-CM

## 2017-04-13 LAB — POCT URINALYSIS DIP (DEVICE)
Bilirubin Urine: NEGATIVE
GLUCOSE, UA: NEGATIVE mg/dL
Hgb urine dipstick: NEGATIVE
Ketones, ur: NEGATIVE mg/dL
Leukocytes, UA: NEGATIVE
NITRITE: NEGATIVE
PROTEIN: NEGATIVE mg/dL
Specific Gravity, Urine: 1.02 (ref 1.005–1.030)
UROBILINOGEN UA: 0.2 mg/dL (ref 0.0–1.0)
pH: 7 (ref 5.0–8.0)

## 2017-04-13 LAB — POCT GLYCOSYLATED HEMOGLOBIN (HGB A1C): HEMOGLOBIN A1C: 6.8

## 2017-04-13 MED ORDER — AMLODIPINE BESYLATE 5 MG PO TABS: 5.0000 mg | ORAL_TABLET | Freq: Every day | ORAL | 1 refills | Status: DC

## 2017-04-13 MED ORDER — MELOXICAM 7.5 MG PO TABS
7.5000 mg | ORAL_TABLET | Freq: Every day | ORAL | 1 refills | Status: DC
Start: 2017-04-13 — End: 2022-04-09

## 2017-04-13 MED ORDER — SULFAMETHOXAZOLE-TRIMETHOPRIM 800-160 MG PO TABS: 1.0000 | ORAL_TABLET | Freq: Two times a day (BID) | ORAL | 0 refills | Status: DC

## 2017-04-13 NOTE — Progress Notes (Signed)
Subjective:    Patient ID: Laura Richardson, female    DOB: [birth year], 62 y.o.   MRN: [id number]  HPI Laura Richardson, a  62 year old female with a history of type 2 diabetes and hypertension presents for a 3 month follow up. Laura Richardson primarily speaks spanish, daughter is assisting with communication. Laura Richardson is also complaining of a boil to right buttocks for 2 weeks. She has been applying warm compresses without relief. She characterized boil as warm and tender. She has not attempted any OTC interventions to alleviate tenderness.   Laura Richardson has a history of hypertension.  She is not exercising routinely and is not adherent to low salt diet.  She does not check blood pressure at home.Patient denies chest pain, chest pressure/discomfort, dyspnea, fatigue, near-syncope, orthopnea and tachypnea.  Cardiovascular risk factors include prediabetes and a sedentary lifestyle  Patient also has a history of pre diabetes. Previous hemoglobin a1C was 6.5.She does not follow a carbohydrate modified diet. Symptoms of diabetes include polyuria. She also endorses urinary frequency and dysuria. She denies  foot ulcerations, paresthesia of the feet, polydipsia, visual disturbances, vomitting and weight loss.    Past Medical History:  Diagnosis Date  . Cough   . Diabetes mellitus without complication ([place])   . Gout   . HTN (hypertension)    Social History   Social History Narrative  . Not on file   There is no immunization history on file for this patient.  No Known Allergies  Review of Systems  Constitutional: Negative.   HENT: Negative.   Eyes: Negative.   Respiratory: Negative.   Cardiovascular: Negative.   Gastrointestinal: Negative.   Endocrine: Negative.  Negative for polydipsia, polyphagia and polyuria.  Genitourinary: Negative.   Musculoskeletal: Negative for arthralgias and back pain.  Skin: Negative.   Neurological: Negative.   Hematological: Negative.     Psychiatric/Behavioral: Negative.        Objective:   Physical Exam  Constitutional: She is oriented to person, place, and time. She appears well-developed and well-nourished.  HENT:  Head: Normocephalic and atraumatic.  Right Ear: External ear normal.  Left Ear: External ear normal.  Nose: Nose normal.  Mouth/Throat: Oropharynx is clear and moist.  Eyes: Conjunctivae and EOM are normal. Pupils are equal, round, and reactive to light.  Neck: Normal range of motion. Neck supple.  Cardiovascular: Normal rate, regular rhythm, normal heart sounds and intact distal pulses.  Pulmonary/Chest: Effort normal and breath sounds normal.  Abdominal: Soft. Bowel sounds are normal.  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 She has normal reflexes.  Skin: Skin is warm and dry.     Erythematous, induration, 1 cm, tender to palpation, non draining  Psychiatric: She has a normal mood and affect. Her behavior is normal. Judgment and thought content normal.   BP 132/82 (BP Location: Right Arm, Patient Position: Sitting, Cuff Size: Normal) Comment: MANUALLY  Pulse 82   Temp 97.9 F (36.6 C) (Oral)   Resp 14   Ht 5\' 4"  (1.626 m)   Wt 176 lb (79.8 kg)   SpO2 99%   BMI 30.21 kg/m     Assessment & Plan:  1. Type 2 diabetes mellitus without complication, without long-term current use of insulin (HCC) Hemoglobin a1C is 6.7, which is consistent with type 2 diabetes mellitus.  Will continue Metformin 500 mg daily with breakfast.  Discussed the importance of  following a carbohydrate modified diet.  Reviewed urinalysis, no glucosuria present.  Hemoglobin a1C is at goal.  - HgB A1c - Microalbumin/Creatinine Ratio, Urine - POCT urinalysis dip (device) - Basic Metabolic Panel  2. Essential hypertension Blood pressure is at goal on current medication regimen.  - Microalbumin/Creatinine Ratio, Urine - amLODipine  (NORVASC) 5 MG tablet; Take 1 tablet (5 mg total) by mouth daily.  Dispense: 90 tablet; Refill: 1 - Basic Metabolic Panel  3. Abscess of buttock, right Continue to apply warm, moist compresses to right buttocks Wash with Dial antibacterial soap twice daily - sulfamethoxazole-trimethoprim (BACTRIM DS,SEPTRA DS) 800-160 MG tablet; Take 1 tablet by mouth 2 (two) times daily.  Dispense: 20 tablet; Refill: 0  4. Arthritis - meloxicam (MOBIC) 7.5 MG tablet; Take 1 tablet (7.5 mg total) by mouth daily.  Dispense: 30 tablet; Refill: 1  5. Language barrier to communication Patient primarily speaks spanish, daughter assisting with communicationl   RTC: Follow up in 6 months for chronic conditions   [place]  MSN, FNP-C [facility] [address] [place], [place] [address] [phone]

## 2017-04-13 NOTE — Patient Instructions (Addendum)
You have an abscess to right buttock, will start Bactrim 800-160 mg every 12 hours for 10 days.  Take medication with food to prevent GI upset.  Apply warm moist compresses to right buttock as needed. Your blood pressure is at goal today no medication changes are warranted.  Your hemoglobin A1c is 6.8.  Which is consistent with type 2 diabetes mellitus.  I recommend a low-fat, low carbohydrate diet divided over 5-6 small meals throughout the day.  Consume larger meal earlier in the day.  Recommend a low impact cardiovascular exercise regimen 3-4 days/week.  Will continue metformin 500 mg daily.  For arthritis pain we will start a trial of meloxicam 7.5 mg daily with food. Diabetes mellitus y nutricin Diabetes Mellitus and Nutrition Si sufre de diabetes (diabetes mellitus), es muy importante tener hbitos alimenticios saludables debido a que sus niveles de Designer, television/film set sangre (glucosa) se ven afectados en gran medida por lo que come y bebe. Comer alimentos saludables en las cantidades [place], aproximadamente a la [employer], [place] ayudar a:  Aeronautical engineer glucemia.  Disminuir el riesgo de sufrir una enfermedad cardaca.  Mejorar la presin arterial.  Science writer o mantener un peso saludable.  Todas las personas que sufren de diabetes son diferentes y cada una tiene necesidades diferentes en cuanto a un plan de alimentacin. El mdico puede recomendarle que trabaje con un especialista en dietas y nutricin (nutricionista) para Financial trader plan para usted. Su plan de alimentacin puede variar segn factores como:  Las caloras que necesita.  Los medicamentos que toma.  Su peso.  Sus niveles de glucemia, presin arterial y colesterol.  Su nivel de [place].  Otras afecciones que tenga, como enfermedades cardacas o renales.  Cmo me afectan los carbohidratos? Los carbohidratos afectan el nivel de glucemia ms que cualquier otro tipo de alimento. La ingesta de  carbohidratos naturalmente aumenta la cantidad glucosa en la sangre. El recuento de carbohidratos es un mtodo destinado a Catering manager un registro de la cantidad de carbohidratos que se ingieren. El recuento de carbohidratos es importante para Theatre manager la glucemia a un nivel saludable, en especial si utiliza insulina o toma determinados medicamentos por va oral para la diabetes. Es importante saber la cantidad de carbohidratos que se pueden ingerir en cada comida sin correr Engineer, manufacturing. Esto es Psychologist, forensic. El nutricionista puede ayudarlo a calcular la cantidad de carbohidratos que debe ingerir en cada comida y colacin. Los alimentos que contienen carbohidratos incluyen:  Pan, cereal, arroz, pasta y galletas.  Papas y maz.  Guisantes, frijoles y lentejas.  [place]eche y Estate agent.  Lambert Mody y [place].  Postres, como pasteles, galletitas, helado y caramelos.  Cmo me afecta el alcohol? El alcohol puede provocar disminuciones sbitas de la glucemia (hipoglucemia), en especial si utiliza insulina o toma determinados medicamentos por va oral para la diabetes. La hipoglucemia es una afeccin potencialmente mortal. Los sntomas de la hipoglucemia (somnolencia, mareos y confusin) son similares a los sntomas de haber consumido demasiado alcohol. Si el mdico afirma que el alcohol es seguro para usted, siga estas pautas:  Limite el consumo de alcohol a no ms de 1 medida por da si es mujer y no est Music therapist, y a 2 medidas si es hombre. Una medida equivale a 12oz (370ml) de cerveza, 5oz (154ml) de vino o 1oz (58ml) de bebidas de alta graduacin alcohlica.  No beba con el estmago vaco.  Mantngase hidratado con agua, gaseosas dietticas o t helado sin azcar.  Tenga en cuenta que las gaseosas comunes, los jugos y otros refrescos pueden contener mucha azcar y se deben contar como carbohidratos.  Consejos para seguir Photographer las etiquetas de los alimentos  Comience por  controlar el tamao de la porcin en la etiqueta. La cantidad de caloras, carbohidratos, grasas y otros nutrientes mencionados en la etiqueta se basan en una porcin del alimento. Muchos alimentos contienen ms de una porcin por envase.  Verifique la cantidad total de gramos (g) de carbohidratos totales en una porcin. Puede calcular la cantidad de porciones de carbohidratos al dividir el total de carbohidratos por 15. Por ejemplo, si un alimento posee un total de 30g de carbohidratos, equivale a 2 porciones de carbohidratos.  Verifique la cantidad de gramos (g) de grasas saturadas y grasas trans en una porcin. Escoja alimentos que no contengan grasa o que tengan un bajo contenido.  Controle la cantidad de miligramos (mg) de sodio en una porcin. La [employer] de las personas deben limitar la ingesta de sodio total a menos de 2300mg  por Training and development officer.  Siempre consulte la informacin nutricional de los alimentos etiquetados como "con bajo contenido de grasa" o "sin grasa". Estos alimentos pueden ser ms altos en azcar agregada o en carbohidratos refinados y deben evitarse.  Hable con el nutricionista para identificar sus objetivos diarios en cuanto a los nutrientes mencionados en la etiqueta. De compras  Evite comprar alimentos procesados, enlatados o prehechos. Estos alimentos tienden a TEFL teacher cantidad de [place], sodio y azcar agregada.  Compre en la zona exterior de la tienda de comestibles. Esta incluye frutas y [employer], granos a granel, carnes frescas y productos lcteos frescos. Coccin  Utilice mtodos de coccin a baja temperatura, como hornear, en lugar de mtodos de coccin a alta temperatura, como frer en abundante aceite.  Cocine con aceites saludables, como el aceite de [place], canola o [place].  Evite cocinar con manteca, crema o carnes con alto contenido de grasa. Planificacin de las comidas  [employer] comidas y las colaciones de forma regular, preferentemente a la  misma hora todos [place]. Evite pasar largos perodos de tiempo sin comer.  Consuma alimentos ricos en fibra, como frutas frescas, verduras, frijoles y cereales integrales. Consulte al nutricionista sobre cuntas porciones de carbohidratos puede consumir en cada comida.  Consuma entre 4 y 6 onzas de protenas magras por da, como carnes [place], pollo, pescado, [employer] o tofu. 1 onza equivale a 1 onza de carne, pollo o pescado, 1 huevo, o 1/4 taza de tofu.  Coma algunos alimentos por da que contengan grasas saludables, como aguacates, frutos secos, semillas y pescado. Estilo de vida   Controle su nivel de glucemia con regularidad.  Haga ejercicio al menos 16minutos, 5das o ms por semana, o como se lo haya indicado el mdico.  Tome los [employer] se lo haya indicado el mdico.  No consuma ningn producto que contenga nicotina o tabaco, como cigarrillos y Psychologist, sport and exercise. Si necesita ayuda para dejar de fumar, consulte al [employer] con un asesor o instructor en diabetes para identificar estrategias para controlar el estrs y cualquier desafo emocional y social. Cules son algunas de las preguntas que puedo hacerle a mi mdico?  Es necesario que me rena con Radio broadcast assistant en diabetes?  Es necesario que me rena con un nutricionista?  A qu nmero puedo llamar si tengo preguntas?  Cules son los mejores momentos para controlar la glucemia? Dnde encontrar ms informacin:  Asociacin Americana de la Diabetes (American  Diabetes Association): diabetes.org/food-and-fitness/food  Academia de Nutricin y Information systems manager (Academy of Nutrition and Dietetics): PokerClues.dk  [place] Diabetes y [place] y Water quality scientist [facility] of Diabetes and Digestive and Kidney Diseases) ([place], NIH):  ContactWire.be Resumen  Un plan de alimentacin saludable lo ayudar a Aeronautical engineer glucemia y Theatre manager un estilo de vida saludable.  Trabajar con un especialista en dietas y nutricin (nutricionista) puede ayudarlo a Insurance claims handler de alimentacin para usted.  Tenga en cuenta que los carbohidratos y el alcohol tienen efectos inmediatos en sus niveles de glucemia. Es importante contar los carbohidratos y consumir alcohol con prudencia. Esta informacin no tiene Marine scientist el consejo del mdico. Asegrese de hacerle al mdico cualquier pregunta que tenga. Document Released: 07/01/2007 Document Revised: 07/14/2016 Document Reviewed: 07/14/2016 Elsevier Interactive Patient Education  2018 [employer].

## 2017-04-14 LAB — BASIC METABOLIC PANEL
BUN/Creatinine Ratio: 18 (ref 12–28)
BUN: 14 mg/dL (ref 8–27)
CO2: 21 mmol/L (ref 20–29)
CREATININE: 0.76 mg/dL (ref 0.57–1.00)
Calcium: 9.7 mg/dL (ref 8.7–10.3)
Chloride: 101 mmol/L (ref 96–106)
GFR calc non Af Amer: 84 mL/min/{1.73_m2} (ref >59)
GFR, EST AFRICAN AMERICAN: 97 mL/min/{1.73_m2} (ref >59)
GLUCOSE: 125 mg/dL — AB (ref 65–99)
Potassium: 4.2 mmol/L (ref 3.5–5.2)
SODIUM: 140 mmol/L (ref 134–144)

## 2017-04-14 LAB — MICROALBUMIN / CREATININE URINE RATIO
Creatinine, Urine: 33.5 mg/dL
Microalb/Creat Ratio: 23 mg/g creat (ref 0.0–30.0)
Microalbumin, Urine: 7.7 ug/mL

## 2017-04-18 DIAGNOSIS — E119 Type 2 diabetes mellitus without complications: Secondary | ICD-10-CM | POA: Insufficient documentation

## 2017-04-18 DIAGNOSIS — Z789 Other specified health status: Secondary | ICD-10-CM | POA: Insufficient documentation

## 2017-04-18 DIAGNOSIS — I10 Essential (primary) hypertension: Secondary | ICD-10-CM | POA: Insufficient documentation

## 2017-04-18 DIAGNOSIS — M1990 Unspecified osteoarthritis, unspecified site: Secondary | ICD-10-CM | POA: Insufficient documentation

## 2017-05-14 ENCOUNTER — Other Ambulatory Visit: Payer: Self-pay | Admitting: Family Medicine

## 2017-05-14 DIAGNOSIS — R7303 Prediabetes: Secondary | ICD-10-CM

## 2017-05-20 ENCOUNTER — Ambulatory Visit (INDEPENDENT_AMBULATORY_CARE_PROVIDER_SITE_OTHER): Payer: No Typology Code available for payment source | Admitting: Family Medicine

## 2017-05-20 VITALS — BP 139/67 | HR 78 | Temp 98.1°F | Resp 16 | Ht 64.0 in | Wt 177.0 lb

## 2017-05-20 DIAGNOSIS — R197 Diarrhea, unspecified: Secondary | ICD-10-CM

## 2017-05-20 DIAGNOSIS — G629 Polyneuropathy, unspecified: Secondary | ICD-10-CM

## 2017-05-20 DIAGNOSIS — Z789 Other specified health status: Secondary | ICD-10-CM

## 2017-05-20 LAB — POCT URINALYSIS DIP (DEVICE)
BILIRUBIN URINE: NEGATIVE
Glucose, UA: NEGATIVE mg/dL
HGB URINE DIPSTICK: NEGATIVE
Ketones, ur: NEGATIVE mg/dL
LEUKOCYTES UA: NEGATIVE
NITRITE: NEGATIVE
Protein, ur: NEGATIVE mg/dL
Specific Gravity, Urine: 1.015 (ref 1.005–1.030)
Urobilinogen, UA: 0.2 mg/dL (ref 0.0–1.0)
pH: 7 (ref 5.0–8.0)

## 2017-05-20 MED ORDER — GABAPENTIN 100 MG PO CAPS: 100.0000 mg | ORAL_CAPSULE | Freq: Three times a day (TID) | ORAL | 0 refills | Status: DC

## 2017-05-20 MED ORDER — SACCHAROMYCES BOULARDII 250 MG PO CAPS: 250.0000 mg | ORAL_CAPSULE | Freq: Two times a day (BID) | ORAL | 0 refills | Status: AC

## 2017-05-20 NOTE — Patient Instructions (Addendum)
Will start a trial of gabapentin 100 mg at bedtime for lower extremity neuropathy.  For diarrhea, recommend a lowfat, bland diet over the next 48 hours. Will also start OTC Florastor 250 mg twice daily for 2 weeks.    Opciones de alimentos para ayudar a Corporate treasurer to Help Relieve Diarrhea, Adult Cuando tiene diarrea, los alimentos que ingiere y los hbitos de alimentacin son Theatre stage manager. Elegir los Reliant Energy y las bebidas adecuados puede ayudar a lo siguiente:  Holiday representative.  Remplazar los lquidos y nutrientes perdidos.  Evitar la deshidratacin.  Qu pautas generales debo seguir? Alivio de la diarrea  Elija alimentos con menos de 2g o 0,07oz de fibra por porcin.  Limite las grasas a menos de 8cucharaditas (38g o 1,34oz) por da.  Evite consumir lo siguiente: ? Alimentos y bebidas endulzados con jarabe de maz de alto contenido de fructosa, miel o alcoholes de Location manager, como xilitol, sorbitol y manitol. ? Alimentos que contengan mucha grasa o azcar. ? Comidas fritas, grasosas o picantes. ? Granos, panes y cereales con alto contenido de fibras. ? Lambert Mody y verduras crudas.  Consuma alimentos con alto contenido de probiticos. Entre ellos, los productos lcteos, como el yogur y los productos fermentados de la [place]. Estos ayudan a aumentar la cantidad de bacterias del estmago y de los intestinos (tubo digestivo o gastrointestinal [GI]).  Si tiene intolerancia a la lactosa, evite los productos lcteos. Estos podran empeorar la diarrea.  Solo tome antibiticos para detener la diarrea (medicamentos antidiarreicos) como se lo haya indicado el mdico. Remplazo de nutrientes  Coma pequeas comidas o colaciones cada 3o4horas.  Coma alimentos blandos, como arroz blanco, pan tostado o papas al horno, hasta que comience a recuperarse de la diarrea. De forma gradual, reincorpore alimentos con alto contenido de nutrientes segn lo  tolere o segn se lo haya indicado el mdico. Esto incluye lo siguiente: ? Alimentos proteicos bien cocidos. ? Lambert Mody y verduras peladas, sin semillas y apenas cocidas. ? Productos lcteos descremados.  Tome suplementos de vitaminas y WellPoint se lo haya indicado el mdico. Product/process development scientist la deshidratacin   Comience bebiendo pequeos sorbos de agua o de alguna solucin especial para evitar la deshidratacin(solucin de rehidratacin oral, SRO). Si bebe una cantidad de lquidos suficiente, la orina ser de tono claro o color amarillo plido.  Intente beber, al menos, entre 8y10tazas de lquidos todos los das para ayudar a Microsoft lquidos perdidos.  Podra agregar otros lquidos adems del agua, como jugos claros o bebidas deportivas descafeinadas, segn lo tolere o segn las indicaciones del mdico.  Evite consumir bebidas con cafena, como caf, t o gaseosas.  Evite el alcohol. Qu alimentos se recomiendan? Esta podra no ser [employer] Foods Company de los elementos recomendados. Hable con el mdico sobre las mejores opciones alimenticias para usted. Cereales Arroz Garner, francs o pita (fresco o tostado), incluidos los [place], los bollos y las rosquillas. Pastas blancas. Galletas de [place], [place] o [place]. Pretzels. Cereales con [place]. Cereales cocidos en agua (como harina de maz, smola o crema de cereales). Muffins. Pan cimo. Tostada Melba. [place]. Verduras Papas (sin cscara). La mayora de las verduras bien cocidas y enlatadas sin semillas ni cscara. Valeda Malm tierna. Frutas Pur de [place]. Frutas enlatadas en jugo. Damascos, cerezas, pomelos, duraznos, peras o ciruelas cocidos. Bananas y [employer] International. Carnes y otros alimentos proteicos. Pollo al horno o hervido. Huevos. Tofu. Pescado. Mariscos. Mantequilla suave de frutos secos. Carne  [place] o un bife tierno [employer], jamn, ternera, cordero, cerdo o aves. Lcteos Yogur natural,  kefir y Estate agent bebible sin Risk analyst. Leche deslactosada, suero de la [place], [place] soja. Queso duro descremado o semidescremado. [employer]. Bebidas deportivas de bajas caloras. Jugos de frutas sin pulpa. Jugos colados de tomates o de verduras. Ts descafeinados. Bebidas sin azcar no endulzadas con alcoholes de azcar. Soluciones de rehidratacin oral, si el mdico lo autoriza. Condimentos y otros alimentos Consom, caldo o sopas hechas con los alimentos recomendados. Qu alimentos no se recomiendan? Esta podra no ser [employer] Foods Company de los elementos recomendados. Hable con el mdico sobre las mejores opciones alimenticias para usted. Cereales Galletas, pastas, panecillos y panes integrales, de trigo integral, salvado o centeno. Arroz integral o arroz salvaje. Cereales integrales o de salvado. Cebada. Avena. Tortillas de maz o tacos. Granola. Palomitas de maz. Verduras Verduras crudas. Verduras fritas. Repollo, brcoli, repollitos de Bruselas, alcachofas, porotos cocidos, hojas de remolacha, maz, col rizada, legumbres, guisantes y batatas. Cscara de papas. [place] y repollo cocidos. Lambert Mody Frutas secas, incluidas las ciruelas y los dtiles. Frutas crudas. Compota o ciruelas secas. Frutas enlatadas con almbar. Carne y otros alimentos con protenas Carnes fritas o grasosas. Fiambres. Mantequillas de frutos secos espesas. Frutos secos y semillas. Porotos y lentejas. Panceta. Perros calientes. Salchichas. Lcteos Quesos con alto contenido de [place]. Salvadore Dom, leche chocolatada y bebidas hechas con [place], [place] los batidos. Mitad leche y Haematologist. Crema y Tax inspector. Helados. Bebidas Bebidas con cafena (como caf, t, refrescos o bebidas energizantes). Bebidas alcohlicas. Jugos de frutas con pulpa. Jugo de ciruelas. Bebidas endulzadas con jarabe de maz de alto contenido de fructosa o alcoholes de Location manager. Bebidas deportivas con muchas caloras. Grasas y  [employer]. Salsas a base de crema. Margarina. Aceites para ensaladas. Condimentos para ensaladas. Aceitunas. Aguacates. Mayonesa. Dulces y DIRECTV, donas y pan dulce. Postres sin azcar endulzados con alcoholes de azcar, tales como xilitol y sorbitol. Condimentos y [place]. Salsa picante. [place] en polvo. Salsas. Sopas a base de crema o [place]. Panqueques y waffles. Resumen  Cuando tiene [place], los alimentos que ingiere y los hbitos de alimentacin son Theatre stage manager.  No olvide tomar, al Walgreen, entre 8y10tazas de lquido [employer] o lo que sea suficiente para Theatre manager la orina clara o de color amarillo plido.  Coma alimentos blandos y, de forma gradual, reincorpore alimentos saludables con alto contenido de nutrientes segn lo tolere o segn se lo haya indicado el mdico.  Evite consumir alimentos fritos, grasosos, picantes o con alto contenido de [place]. Esta informacin no tiene Marine scientist el consejo del mdico. Asegrese de hacerle al mdico cualquier pregunta que tenga. Document Released: 03/24/2005 Document Revised: 06/27/2016 Document Reviewed: 06/27/2016 Elsevier Interactive Patient Education  2018 [place] en los adultos (Diarrhea, Adult) La diarrea ocurre cuando la materia fecal (heces) es frecuentemente blanda y [place]. La diarrea puede hacerlo sentir dbil y causarle deshidratacin. La deshidratacin puede hacerlo sentir cansado y sediento, producirle sequedad en la boca y disminuir la frecuencia con la que [place]. La diarrea suele durar 2 o 3das. Sin embargo, puede durar ms tiempo si se trata de un signo de algo ms serio. Es importante tratar la diarrea como se lo haya indicado el mdico. CUIDADOS EN EL [employer] Comida y bebida Siga estas recomendaciones como se lo haya indicado el mdico:  Tome una solucin de rehidratacin oral (SRO). Es Ardelia Mems bebida que se vende  en farmacias y tiendas.  Beba  lquidos claros, por ejemplo: ? [place]. ? Cubitos de hielo. ? Jugo de frutas diluido. ? Bebidas deportivas de bajas caloras.  En la medida en que pueda, consuma alimentos blandos y fciles de digerir en pequeas cantidades. Ellos son: ? Bananas. ? Pur de [employer] International. ? Arroz. ? Carnes bajas en grasa Dorothea Glassman). ? Tostadas. ? Galletas.  Evite beber lquidos que contengan mucha azcar o cafena.  Evite el alcohol.  Evite los alimentos picantes o grasos. Instrucciones generales  Beba suficiente lquido para mantener el pis (orina) claro o de color amarillo plido.  Lvese las manos con frecuencia. Use un desinfectante para manos si no dispone de [place] y [place].  Asegrese de que todas las personas que viven en su casa se laven bien las manos y con frecuencia.  Tome los medicamentos de venta libre y los recetados solamente como se lo haya indicado el mdico.  Descanse en su casa hasta sentirse mejor.  Controle su afeccin para ver si hay cambios.  Tome un bao con agua tibia para Writer ardor o dolor a causa de la diarrea.  Concurra a todas las visitas de control como se lo haya indicado el mdico. Esto es importante. SOLICITE AYUDA SI:  Tiene fiebre.  La diarrea empeora.  Aparecen nuevos sntomas.  No puede retener los lquidos.  Se siente mareado o siente que va a desvanecerse.  Tiene dolores de [place].  Tiene calambres musculares. SOLICITE AYUDA DE INMEDIATO SI:  Siente dolor en el pecho.  Se siente muy dbil o se desvanece (se desmaya).  Tiene heces con sangre, de color negro o con aspecto alquitranado.  Tiene dolor muy intenso en el vientre (abdomen), clicos o meteorismo.  Le cuesta respirar o respira muy rpidamente.  Su corazn late muy rpidamente.  Siente la piel fra y hmeda.  Se siente confundido.  Tiene signos de deshidratacin, por ejemplo: ? La [place] es [place], es muy escasa o no orina. ? Labios agrietados. ? Tesoro Corporation. ? Ojos  hundidos. ? Somnolencia. ? Debilidad. Esta informacin no tiene Marine scientist el consejo del mdico. Asegrese de hacerle al mdico cualquier pregunta que tenga. Document Released: 03/10/2012 Document Revised: 07/16/2015 Document Reviewed: 11/28/2014 Elsevier Interactive Patient Education  Henry Schein.

## 2017-05-20 NOTE — Progress Notes (Signed)
Subjective:    Patient ID: Laura Richardson, female    DOB: 12/14/1955, 62 y.o.   MRN: [id number]  HPI Laura Richardson, a 62 year old female with a history of type 2 diabetes mellitus and hypertension presents complaining of diarrhea over the past 3 days. She has experienced 3-4 loose stools per day. Stools are described as watery. She says that she has not been following a bland diet since this problem started and has not attempted any OTC interventions to alleviate current symptoms. Patient denies any sick contacts. She has not changed diet, started new medications, or attempted new foods. She denies fever, fatigue, vomiting, or dysuria.  [name] primarily speaks spanish, daughter assisting with communication.  Past Medical History:  Diagnosis Date  . Cough   . Diabetes mellitus without complication ([place])   . Gout   . HTN (hypertension)    Social History   Socioeconomic History  . Marital status: Married    Spouse name: Not on file  . Number of children: Not on file  . Years of education: Not on file  . Highest education level: Not on file  Social Needs  . Financial resource strain: Not on file  . Food insecurity - worry: Not on file  . Food insecurity - inability: Not on file  . Transportation needs - medical: Not on file  . Transportation needs - non-medical: Not on file  Occupational History  . Occupation: unemployed  Tobacco Use  . Smoking status: Never Smoker  . Smokeless tobacco: Never Used  Substance and Sexual Activity  . Alcohol use: No  . Drug use: No  . Sexual activity: Not on file  Other Topics Concern  . Not on file  Social History Narrative  . Not on file   There is no immunization history on file for this patient.    Review of Systems  Constitutional: Negative for appetite change, chills, fatigue, fever and unexpected weight change.  HENT: Negative.   Respiratory: Negative.   Cardiovascular: Negative.   Gastrointestinal: Positive for  diarrhea.  Endocrine: Negative for polydipsia, polyphagia and polyuria.  Genitourinary: Negative.  Negative for dysuria.  Skin: Negative.   Neurological: Positive for numbness (bilateral lower extremity numbness and tingling).  Hematological: Negative.        Objective:   Physical Exam  Constitutional: She is oriented to person, place, and time.  Eyes: Pupils are equal, round, and reactive to light.  Cardiovascular: Normal rate and intact distal pulses.  Pulmonary/Chest: Effort normal and breath sounds normal.  Abdominal: Soft. Bowel sounds are normal.  Neurological: She is alert and oriented to person, place, and time.  Skin: Skin is warm and dry.  Psychiatric: She has a normal mood and affect. Her behavior is normal. Judgment and thought content normal.         BP 139/67 (BP Location: Left Arm, Patient Position: Sitting, Cuff Size: Large)   Pulse 78   Temp 98.1 F (36.7 C) (Oral)   Resp 16   Ht 5\' 4"  (1.626 m)   Wt 177 lb (80.3 kg)   SpO2 97%   BMI 30.38 kg/m   Assessment & Plan:  1. Diarrhea, unspecified type Recommend a bland diet over the next 48 hours. Slowly re-introduce regular diet.  Recommend probiotic over the next 2 weeks.  Increase fluid intake Reviewed urinalysis, no signs of dehydration - saccharomyces boulardii (FLORASTOR) 250 MG capsule; Take 1 capsule (250 mg total) by mouth 2 (two) times daily for 14 days.  Dispense: 30 capsule; Refill: 0 - CBC - Basic Metabolic Panel - POCT urinalysis dip (device)  2. Neuropathy - gabapentin (NEURONTIN) 100 MG capsule; Take 1 capsule (100 mg total) by mouth 3 (three) times daily.  Dispense: 30 capsule; Refill: 0  3. Language barrier to communication Patient primarily speaks spanish, daughter interpreting to assist with communication   RTC: Follow up as previously scheduled for chronic conditions   Donia Pounds  MSN, FNP-C Patient [facility] [address] [place], [place] [address] [phone]   The patient was given clear instructions to go to ER or return to medical center if symptoms do not improve, worsen or new problems develop. The patient verbalized understanding.

## 2017-05-21 LAB — CBC
Hematocrit: 45.9 % (ref 34.0–46.6)
Hemoglobin: 15 g/dL (ref 11.1–15.9)
MCH: 25.7 pg — ABNORMAL LOW (ref 26.6–33.0)
MCHC: 32.7 g/dL (ref 31.5–35.7)
MCV: 79 fL (ref 79–97)
Platelets: 282 10*3/uL (ref 150–379)
RBC: 5.84 x10E6/uL — ABNORMAL HIGH (ref 3.77–5.28)
RDW: 14.6 % (ref 12.3–15.4)
WBC: 9.3 10*3/uL (ref 3.4–10.8)

## 2017-05-21 LAB — BASIC METABOLIC PANEL
BUN/Creatinine Ratio: 19 (ref 12–28)
BUN: 14 mg/dL (ref 8–27)
CO2: 21 mmol/L (ref 20–29)
CREATININE: 0.73 mg/dL (ref 0.57–1.00)
Calcium: 9.9 mg/dL (ref 8.7–10.3)
Chloride: 99 mmol/L (ref 96–106)
GFR calc Af Amer: 102 mL/min/{1.73_m2} (ref >59)
GFR, EST NON AFRICAN AMERICAN: 89 mL/min/{1.73_m2} (ref >59)
Glucose: 105 mg/dL — ABNORMAL HIGH (ref 65–99)
Potassium: 4.1 mmol/L (ref 3.5–5.2)
SODIUM: 139 mmol/L (ref 134–144)

## 2017-05-23 ENCOUNTER — Encounter: Payer: Self-pay | Admitting: Family Medicine

## 2017-06-11 ENCOUNTER — Encounter: Payer: Self-pay | Admitting: Family Medicine

## 2017-06-11 ENCOUNTER — Ambulatory Visit: Payer: Self-pay | Admitting: Family Medicine

## 2017-06-11 VITALS — BP 137/78 | HR 92 | Temp 97.8°F | Resp 16 | Ht 64.0 in | Wt 174.0 lb

## 2017-06-11 DIAGNOSIS — R109 Unspecified abdominal pain: Secondary | ICD-10-CM

## 2017-06-11 DIAGNOSIS — Z789 Other specified health status: Secondary | ICD-10-CM

## 2017-06-11 LAB — POCT URINALYSIS DIP (DEVICE)
Bilirubin Urine: NEGATIVE
GLUCOSE, UA: NEGATIVE mg/dL
HGB URINE DIPSTICK: NEGATIVE
KETONES UR: NEGATIVE mg/dL
Leukocytes, UA: NEGATIVE
Nitrite: NEGATIVE
PH: 8.5 — AB (ref 5.0–8.0)
PROTEIN: NEGATIVE mg/dL
SPECIFIC GRAVITY, URINE: 1.02 (ref 1.005–1.030)
UROBILINOGEN UA: 0.2 mg/dL (ref 0.0–1.0)

## 2017-06-11 NOTE — Patient Instructions (Signed)
Will follow up by phone after reviewing imaging and laboratory results.  Recommend that you discontinue florastor Recommend a low fat, low carbohydrate diet and decrease dairy intake.   Also increase daily water intake to 4-5 glasses.   Refrain from OTC Ibuprofen or NSAIDs

## 2017-06-11 NOTE — Progress Notes (Signed)
Chief Complaint  Patient presents with  . Abdominal Pain    right side abdominal pain x 2 weeks      Subjective:    Patient ID: Laura Richardson, female    DOB: 1955-11-26, 62 y.o.   MRN: [id number]  HPI  Laura Richardson, a 62 year old female that presents accompanied by grandson complaining of abdominal pain primarily to right upper and lower quadrant for 2 weeks. Patient primarily speaks spanish, grandson is interpreting to assist with communication.  Patient had diarrhea over 2 weeks ago and was treated with Florastor. She says that since that time she has been experiencing abdominal pain. Pain is characterized as intermittent and sharp. Also, current pain intensity is 3/10. She has not identified any palliative or provocative factors. She denies fever, headache, chest pain, shortness of breath, dysuria, nausea, vomiting, diarrhea, or constipation.    Past Medical History:  Diagnosis Date  . Cough   . Diabetes mellitus without complication ([place])   . Gout   . HTN (hypertension)    Social History   Socioeconomic History  . Marital status: Married    Spouse name: Not on file  . Number of children: Not on file  . Years of education: Not on file  . Highest education level: Not on file  Social Needs  . Financial resource strain: Not on file  . Food insecurity - worry: Not on file  . Food insecurity - inability: Not on file  . Transportation needs - medical: Not on file  . Transportation needs - non-medical: Not on file  Occupational History  . Occupation: unemployed  Tobacco Use  . Smoking status: Never Smoker  . Smokeless tobacco: Never Used  Substance and Sexual Activity  . Alcohol use: No  . Drug use: No  . Sexual activity: Not on file  Other Topics Concern  . Not on file  Social History Narrative  . Not on file   Review of Systems  HENT: Negative.   Cardiovascular: Negative.   Gastrointestinal: Positive for abdominal pain (right side).  Genitourinary: Negative  for frequency and hematuria.  Hematological: Negative.   Psychiatric/Behavioral: Negative.        Objective:   Physical Exam  Constitutional: She is oriented to person, place, and time. She appears well-developed and well-nourished.  HENT:  Head: Normocephalic and atraumatic.  Right Ear: External ear normal.  Left Ear: External ear normal.  Nose: Nose normal.  Mouth/Throat: Oropharynx is clear and moist.  Eyes: Conjunctivae are normal. Pupils are equal, round, and reactive to light.  Neck: Normal range of motion. Neck supple.  Cardiovascular: Normal rate, regular rhythm, normal heart sounds and intact distal pulses.  Pulmonary/Chest: Effort normal and breath sounds normal.  Abdominal: Soft. Bowel sounds are normal. There is tenderness in the right upper quadrant and right lower quadrant.    Neurological: She is alert and oriented to person, place, and time. She has normal reflexes.  Skin: Skin is warm and dry.  Psychiatric: She has a normal mood and affect. Her behavior is normal. Judgment and thought content normal.       BP 137/78 (BP Location: Right Arm, Patient Position: Sitting, Cuff Size: Normal)   Pulse 92   Temp 97.8 F (36.6 C) (Oral)   Resp 16   Ht 5\' 4"  (1.626 m)   Wt 174 lb (78.9 kg)   SpO2 98%   BMI 29.87 kg/m  Assessment & Plan:  Right flank pain - POCT urinalysis dip (device) - US  Abdomen Complete; Future - Urine Culture - Comprehensive metabolic panel - CBC  Abdominal pain, unspecified abdominal location - POCT urinalysis dip (device) - US Abdomen Complete; Future - Urine Culture - Comprehensive metabolic panel - CBC  Language barrier to communication Patient primarily speaks [place], grandson assisting with communication   RTC: Will follow up after reviewing lab and imaging results  Donia Pounds  MSN, FNP-C Patient [facility] [address] [place], [place] [address] [phone]

## 2017-06-12 ENCOUNTER — Ambulatory Visit (HOSPITAL_COMMUNITY)
Admission: RE | Admit: 2017-06-12 | Discharge: 2017-06-12 | Disposition: A | Discharge status: Home / Self Care | Payer: No Typology Code available for payment source | Source: Ambulatory Visit | Attending: Family Medicine | Admitting: Family Medicine

## 2017-06-12 DIAGNOSIS — K769 Liver disease, unspecified: Secondary | ICD-10-CM | POA: Insufficient documentation

## 2017-06-12 DIAGNOSIS — R109 Unspecified abdominal pain: Secondary | ICD-10-CM

## 2017-06-12 LAB — COMPREHENSIVE METABOLIC PANEL
ALBUMIN: 4.6 g/dL (ref 3.6–4.8)
ALK PHOS: 119 IU/L — AB (ref 39–117)
ALT: 17 IU/L (ref 0–32)
AST: 24 IU/L (ref 0–40)
Albumin/Globulin Ratio: 1.5 (ref 1.2–2.2)
BUN / CREAT RATIO: 19 (ref 12–28)
BUN: 15 mg/dL (ref 8–27)
Bilirubin Total: 0.4 mg/dL (ref 0.0–1.2)
CHLORIDE: 103 mmol/L (ref 96–106)
CO2: 21 mmol/L (ref 20–29)
Calcium: 10 mg/dL (ref 8.7–10.3)
Creatinine, Ser: 0.8 mg/dL (ref 0.57–1.00)
GFR calc Af Amer: 91 mL/min/{1.73_m2} (ref >59)
GFR calc non Af Amer: 79 mL/min/{1.73_m2} (ref >59)
GLUCOSE: 109 mg/dL — AB (ref 65–99)
Globulin, Total: 3.1 g/dL (ref 1.5–4.5)
Potassium: 4.1 mmol/L (ref 3.5–5.2)
Sodium: 145 mmol/L — ABNORMAL HIGH (ref 134–144)
TOTAL PROTEIN: 7.7 g/dL (ref 6.0–8.5)

## 2017-06-12 LAB — CBC
HEMATOCRIT: 46.7 % — AB (ref 34.0–46.6)
Hemoglobin: 14.9 g/dL (ref 11.1–15.9)
MCH: 25.6 pg — ABNORMAL LOW (ref 26.6–33.0)
MCHC: 31.9 g/dL (ref 31.5–35.7)
MCV: 80 fL (ref 79–97)
Platelets: 242 10*3/uL (ref 150–379)
RBC: 5.83 x10E6/uL — ABNORMAL HIGH (ref 3.77–5.28)
RDW: 14.5 % (ref 12.3–15.4)
WBC: 9.2 10*3/uL (ref 3.4–10.8)

## 2017-06-13 LAB — URINE CULTURE

## 2017-06-14 ENCOUNTER — Telehealth: Payer: Self-pay | Admitting: Family Medicine

## 2017-06-14 NOTE — Telephone Encounter (Signed)
Laura Richardson, a 62 year old female with a history of DMII and HTN presented on 06/13/2017 complaining of right upper quadrant abdominal pain.  Patient underwent an abdominal ultrasound, which yielded the following results:  Echogenic parenchyma, likely fatty infiltration throughout the liver.  Attempted to contact patient, left message.  Patient has what appears to be fatty liver disease.  Recommend a low-fat, low carbohydrate diet divided over 5-6 small meals throughout the day.  Also, recommend low impact cardiovascular exercise routine 3 days/week for 30 minutes.  Donia Pounds  MSN, FNP-C Patient [facility] [address] [place], [place] [address] [phone]

## 2017-06-16 ENCOUNTER — Telehealth: Payer: Self-pay

## 2017-06-16 NOTE — Telephone Encounter (Signed)
Called and advised patient of china's message regarding fatty liver disease. Advised that she should eat a low fat/ low cholesterol diet over 5 to 6 small meals daily and exercise 3 days a week for 30 minutes of cardio. Patient verbalized understanding. Thanks!

## 2017-07-13 ENCOUNTER — Ambulatory Visit (INDEPENDENT_AMBULATORY_CARE_PROVIDER_SITE_OTHER): Payer: Self-pay | Admitting: Family Medicine

## 2017-07-13 ENCOUNTER — Encounter: Payer: Self-pay | Admitting: Family Medicine

## 2017-07-13 VITALS — BP 117/69 | HR 83 | Temp 98.5°F | Resp 16 | Ht 64.0 in | Wt 174.0 lb

## 2017-07-13 DIAGNOSIS — E119 Type 2 diabetes mellitus without complications: Secondary | ICD-10-CM

## 2017-07-13 DIAGNOSIS — R3915 Urgency of urination: Secondary | ICD-10-CM

## 2017-07-13 DIAGNOSIS — R300 Dysuria: Secondary | ICD-10-CM

## 2017-07-13 DIAGNOSIS — R399 Unspecified symptoms and signs involving the genitourinary system: Secondary | ICD-10-CM

## 2017-07-13 LAB — POCT URINALYSIS DIPSTICK
Bilirubin, UA: NEGATIVE
Glucose, UA: NEGATIVE
KETONES UA: NEGATIVE
NITRITE UA: NEGATIVE
PH UA: 7.5 (ref 5.0–8.0)
PROTEIN UA: NEGATIVE
RBC UA: NEGATIVE
SPEC GRAV UA: 1.02 (ref 1.010–1.025)
UROBILINOGEN UA: 0.2 U/dL

## 2017-07-13 LAB — POCT GLYCOSYLATED HEMOGLOBIN (HGB A1C): HEMOGLOBIN A1C: 6.3

## 2017-07-13 MED ORDER — CIPROFLOXACIN HCL 250 MG PO TABS: 250.0000 mg | ORAL_TABLET | Freq: Two times a day (BID) | ORAL | 0 refills | Status: AC

## 2017-07-13 NOTE — Patient Instructions (Signed)
Will treat empirically for urinary tract symptoms. Will start a trial of ciprofloxacin 250 mg twice daily for 3 days. Increase water intake to 6-8 glasses per day. Will also follow urine culture.     Infeccin de las vas urinarias, en adultos Urinary Tract Infection, Adult Una infeccin de las vas urinarias (IVU) es una infeccin en cualquier parte de las vas urinarias, que incluyen los riones, los urteres, la vejiga y Geologist, engineering. Estos rganos fabrican, Buyer, retail y eliminan la orina del organismo. La IVU puede ser una infeccin de la vejiga (cistitis) o una infeccin renal (pielonefritis). Cules son las causas? Esta infeccin puede deberse a hongos, virus o bacterias. Las bacterias son la causa ms comunes de las IVU. Esta afeccin tambin puede ser provocada por no vaciar la vejiga por completo durante la miccin en repetidas ocasiones. Qu incrementa el riesgo? Es ms probable que esta afeccin se manifieste si:  Usted ignora la necesidad de Garment/textile technologist o retiene la orina durante mucho tiempo.  No vaca la vejiga completamente durante la miccin.  Es Dalene Seltzer y se limpia de atrs hacia adelante despus de Garment/textile technologist o Landscape architect.  Es un hombre y est circuncidado.  Tiene estreimiento.  Tiene colocado un catter urinario (sonda urinaria) [place].  Tiene debilitado el sistema de defensa (inmunitario) del cuerpo.  Tiene una enfermedad que [employer] intestinos, los riones o la vejiga.  Tiene diabetes.  Toma antibiticos con frecuencia o durante largos perodos, y los antibiticos ya no resultan eficaces para combatir algunos tipos de infecciones (resistencia a los antibiticos).  Toma medicamentos que [employer]lett[employer]ard vas [place].  Est expuesto a sustancias qumicas que le irritan las vas urinarias.  Es mujer.  Cules son los signos o los sntomas? Los sntomas de esta afeccin incluyen lo siguiente:  Cristy Hilts.  Miccin frecuente o eliminacin de pequeas cantidades de  orina con frecuencia.  Necesidad urgente de Garment/textile technologist.  Ardor o dolor al [employer].  Orina con mal olor u olor atpico.  Bennie Hind turbia.  Dolor en la parte baja del abdomen o en la espalda.  Dificultad para orinar.  Presencia de [employer].  Tener vmitos o menos apetito de lo normal.  Diarrea o dolor abdominal.  Secrecin vaginal, si es mujer.  Cmo se diagnostica? Esta afeccin se diagnostica con base en la historia clnica y un examen fsico. Tambin deber proporcionar Truddie Coco de orina para realizar anlisis. Podrn indicarle otros estudios, por ejemplo:  Anlisis de [place].  Anlisis de enfermedades de transmisin sexual (ETS).  Si ha tenido ms de una IVU, se pueden hacer estudios de diagnstico por imgenes o una cistoscopia para determinar la causa de las infecciones. Cmo se trata? El tratamiento de esta afeccin suele incluir una combinacin de dos o ms de los siguientes:  Antibiticos.  Otros medicamentos para tratar causas menos frecuentes de IVU.  Medicamentos de venta libre para Best boy.  Cantidad suficiente agua para mantenerse hidratado.  Siga estas indicaciones en su casa:  Tome los medicamentos de venta libre y los recetados solamente como se lo haya indicado el mdico.  Si le recetaron un antibitico, tmelo como se lo haya indicado el mdico. No deje de tomar el antibitico aunque comience a sentirse mejor.  Evite el alcohol, la cafena, el t y las bebidas gaseosas. Estas bebidas pueden irritar la vejiga.  Beba suficiente lquido para Consulting civil engineer orina clara o de color amarillo plido.  Concurra a todas las visitas de seguimiento como se lo haya indicado  el mdico. Esto es importante.  Asegrese de lo siguiente: ? Vaciar la vejiga con frecuencia y en su totalidad. No retener la orina durante largos perodos. ? Vaciar la vejiga antes y despus de Clinical biochemist. ? Limpiarse de adelante hacia atrs despus de  defecar, si es mujer. Usar cada trozo de papel higinico solo una vez cuando se limpie. Comunquese con un mdico si:  Siente dolor en la espalda.  Tiene fiebre.  Siente nuseas o vomita.  Los sntomas no mejoran despus de 3das de tratamiento.  Los sntomas desaparecen y luego vuelven a Arts administrator. Solicite ayuda de inmediato si:  Siente dolor intenso en la espalda o en la zona inferior del abdomen.  Tiene vmitos y no puede tragar los medicamentos ni tomar agua. Esta informacin no tiene Marine scientist el consejo del mdico. Asegrese de hacerle al mdico cualquier pregunta que tenga. Document Released: 01/01/2005 Document Revised: 07/09/2016 Document Reviewed: 02/12/2015 Elsevier Interactive Patient Education  Henry Schein.

## 2017-07-13 NOTE — Progress Notes (Signed)
6.3  

## 2017-07-14 NOTE — Progress Notes (Signed)
Subjective:    Laura Richardson is a 62 y.o. female who complains of burning with urination and urgency for 2 days.   Patient denies back pain, congestion, cough, fever, headache, rhinitis, sorethroat, stomach ache and vaginal discharge.  Patient does not have a history of recurrent UTI.  Also, patient does not have a history of pyelonephritis. Past Medical History:  Diagnosis Date  . Cough   . Diabetes mellitus without complication ([place])   . Gout   . HTN (hypertension)    Social History   Socioeconomic History  . Marital status: Married    Spouse name: Not on file  . Number of children: Not on file  . Years of education: Not on file  . Highest education level: Not on file  Occupational History  . Occupation: unemployed  Social Needs  . Financial resource strain: Not on file  . Food insecurity:    Worry: Not on file    Inability: Not on file  . Transportation needs:    Medical: Not on file    Non-medical: Not on file  Tobacco Use  . Smoking status: Never Smoker  . Smokeless tobacco: Never Used  Substance and Sexual Activity  . Alcohol use: No  . Drug use: No  . Sexual activity: Not on file  Lifestyle  . Physical activity:    Days per week: Not on file    Minutes per session: Not on file  . Stress: Not on file  Relationships  . Social connections:    Talks on phone: Not on file    Gets together: Not on file    Attends religious service: Not on file    Active member of club or organization: Not on file    Attends meetings of clubs or organizations: Not on file    Relationship status: Not on file  . Intimate partner violence:    Fear of current or ex partner: Not on file    Emotionally abused: Not on file    Physically abused: Not on file    Forced sexual activity: Not on file  Other Topics Concern  . Not on file  Social History Narrative  . Not on file    There is no immunization history on file for this patient. Social History   Socioeconomic History  .  Marital status: Married    Spouse name: Not on file  . Number of children: Not on file  . Years of education: Not on file  . Highest education level: Not on file  Occupational History  . Occupation: unemployed  Social Needs  . Financial resource strain: Not on file  . Food insecurity:    Worry: Not on file    Inability: Not on file  . Transportation needs:    Medical: Not on file    Non-medical: Not on file  Tobacco Use  . Smoking status: Never Smoker  . Smokeless tobacco: Never Used  Substance and Sexual Activity  . Alcohol use: No  . Drug use: No  . Sexual activity: Not on file  Lifestyle  . Physical activity:    Days per week: Not on file    Minutes per session: Not on file  . Stress: Not on file  Relationships  . Social connections:    Talks on phone: Not on file    Gets together: Not on file    Attends religious service: Not on file    Active member of club or organization: Not on file  Attends meetings of clubs or organizations: Not on file    Relationship status: Not on file  . Intimate partner violence:    Fear of current or ex partner: Not on file    Emotionally abused: Not on file    Physically abused: Not on file    Forced sexual activity: Not on file  Other Topics Concern  . Not on file  Social History Narrative  . Not on file   Review of Systems  Constitutional: Negative.   HENT: Negative.   Eyes: Negative.   Respiratory: Negative.   Cardiovascular: Negative.   Gastrointestinal: Negative.   Genitourinary: Positive for dysuria and urgency.  Musculoskeletal: Negative.   Skin: Negative.   Neurological: Negative.   Endo/Heme/Allergies: Negative.   Psychiatric/Behavioral: Negative.    Objective:    BP 117/69 (BP Location: Left Arm, Patient Position: Sitting, Cuff Size: Large)   Pulse 83   Temp 98.5 F (36.9 C) (Oral)   Resp 16   Ht 5\' 4"  (1.626 m)   Wt 174 lb (78.9 kg)   SpO2 97%   BMI 29.87 kg/m  General: alert, cooperative and appears  stated age  Abdomen: soft, non-tender, without masses or organomegaly in the entire abdomen  Back: Normal strength and ROM, no CVA tenderness   Laboratory:  Urine dipstick shows trace for leukocyte esterase.     Assessment:    UTI    Plan: Plan:   Dysuria - Urine Culture  Urinary urgency - Urine Culture  Urinary tract infection symptoms Patient has UTI symptoms, will treat empirically with ciprofloxacin 250 mg twice daily for 3 days. - ciprofloxacin (CIPRO) 250 MG tablet; Take 1 tablet (250 mg total) by mouth 2 (two) times daily for 3 days.  Dispense: 20 tablet; Refill:     1. Medications: ciprofloxacin 2. Maintain adequate hydration 3. Follow up if symptoms not improving, and prn.     RTC: As previously scheduled for chronic conditions  Donia Pounds  MSN, FNP-C Patient [facility] [address] [place], [place] [address] [phone]

## 2017-07-15 LAB — URINE CULTURE

## 2017-09-17 ENCOUNTER — Ambulatory Visit (INDEPENDENT_AMBULATORY_CARE_PROVIDER_SITE_OTHER): Payer: Self-pay | Admitting: Family Medicine

## 2017-09-17 ENCOUNTER — Encounter: Payer: Self-pay | Admitting: Family Medicine

## 2017-09-17 VITALS — BP 126/62 | HR 70 | Temp 98.4°F | Resp 16 | Ht 64.0 in | Wt 174.0 lb

## 2017-09-17 DIAGNOSIS — R102 Pelvic and perineal pain: Secondary | ICD-10-CM

## 2017-09-17 DIAGNOSIS — R3589 Other polyuria: Secondary | ICD-10-CM

## 2017-09-17 DIAGNOSIS — R358 Other polyuria: Secondary | ICD-10-CM

## 2017-09-17 DIAGNOSIS — Z789 Other specified health status: Secondary | ICD-10-CM

## 2017-09-17 DIAGNOSIS — R300 Dysuria: Secondary | ICD-10-CM

## 2017-09-17 DIAGNOSIS — G8929 Other chronic pain: Secondary | ICD-10-CM

## 2017-09-17 LAB — POCT URINALYSIS DIPSTICK
Bilirubin, UA: NEGATIVE
Blood, UA: NEGATIVE
Glucose, UA: NEGATIVE
Ketones, UA: NEGATIVE
LEUKOCYTES UA: NEGATIVE
NITRITE UA: NEGATIVE
PH UA: 7.5 (ref 5.0–8.0)
PROTEIN UA: NEGATIVE
SPEC GRAV UA: 1.015 (ref 1.010–1.025)
Urobilinogen, UA: 0.2 E.U./dL

## 2017-09-17 LAB — POCT GLYCOSYLATED HEMOGLOBIN (HGB A1C): HEMOGLOBIN A1C: 6.3 % — AB (ref 4.0–5.6)

## 2017-09-17 NOTE — Progress Notes (Signed)
Subjective:    Laura Richardson is a 62 y.o. female who complains of burning with urination, dysuria, frequency and pain left and right pelvic area for 1 week.Marland Kitchen  She was previously scheduled for pelvic and transvaginal ultrasound, but symptoms dissipated and patient did not follow-up for exam.  Also, has a history of lower urinary tract symptoms.  Patient does not have a history of pyelonephritis.  She denies fever, headache, vaginal dryness, hot flashes, nausea, vomiting, or diarrhea. Past Medical History:  Diagnosis Date  . Cough   . Diabetes mellitus without complication ([place])   . Gout   . HTN (hypertension)    Social History   Socioeconomic History  . Marital status: Married    Spouse name: Not on file  . Number of children: Not on file  . Years of education: Not on file  . Highest education level: Not on file  Occupational History  . Occupation: unemployed  Social Needs  . Financial resource strain: Not on file  . Food insecurity:    Worry: Not on file    Inability: Not on file  . Transportation needs:    Medical: Not on file    Non-medical: Not on file  Tobacco Use  . Smoking status: Never Smoker  . Smokeless tobacco: Never Used  Substance and Sexual Activity  . Alcohol use: No  . Drug use: No  . Sexual activity: Not on file  Lifestyle  . Physical activity:    Days per week: Not on file    Minutes per session: Not on file  . Stress: Not on file  Relationships  . Social connections:    Talks on phone: Not on file    Gets together: Not on file    Attends religious service: Not on file    Active member of club or organization: Not on file    Attends meetings of clubs or organizations: Not on file    Relationship status: Not on file  . Intimate partner violence:    Fear of current or ex partner: Not on file    Emotionally abused: Not on file    Physically abused: Not on file    Forced sexual activity: Not on file  Other Topics Concern  . Not on file  Social  History Narrative  . Not on file   There is no immunization history on file for this patient. Review of Systems  Constitutional: Negative for chills, fever and weight loss.  HENT: Negative.   Eyes: Negative.   Respiratory: Negative.   Cardiovascular: Negative.   Gastrointestinal: Negative.   Genitourinary: Positive for dysuria and urgency. Negative for flank pain and hematuria.  Musculoskeletal: Negative.   Skin: Negative for itching and rash.  Neurological: Negative.   Endo/Heme/Allergies: Negative for environmental allergies and polydipsia.  Psychiatric/Behavioral: Negative.  Negative for depression and suicidal ideas.   Objective:  Laboratory:  Urine dipstick shows negative for all components.     Assessment:   BP 126/62 (BP Location: Left Arm, Patient Position: Sitting, Cuff Size: Normal)   Pulse 70   Temp 98.4 F (36.9 C) (Oral)   Resp 16   Ht 5\' 4"  (1.626 m)   Wt 174 lb (78.9 kg)   SpO2 99%   BMI 29.87 kg/m    Physical Exam  Constitutional: She is oriented to person, place, and time. She appears well-developed and well-nourished.  HENT:  Head: Normocephalic and atraumatic.  Right Ear: External ear normal.  Left Ear: External ear normal.  Eyes: Pupils are equal, round, and reactive to light.  Cardiovascular: Normal rate and regular rhythm.  Pulmonary/Chest: Effort normal and breath sounds normal.  Abdominal: Soft. Bowel sounds are normal. There is no hepatosplenomegaly. There is tenderness in the right lower quadrant and left lower quadrant.  Neurological: She is alert and oriented to person, place, and time.  Skin: Skin is warm and dry.   Plan:  Dysuria - Urinalysis Dipstick - Vaginitis/Vaginosis, DNA Probe - Urine Culture - HgB A1c  Polyuria - HgB G6K - Basic Metabolic Panel  Chronic female pelvic pain - US PELVIC COMPLETE WITH TRANSVAGINAL; Future  1. Medications: not indicated at this time 2. Maintain adequate hydration 3. Follow up if symptoms  not improving, and prn.     Language barrier to communication Patient primarily speaks spanish, son insisting with interpreting    Donia Pounds  MSN, FNP-C Patient [facility] [address] [place], [place] [address] [phone]

## 2017-09-17 NOTE — Patient Instructions (Signed)
Haremos un seguimiento por telfono con cualquier resultado anormal de laboratorio.  Para el dolor plvico crnico continuado, recomiendo una ecografa plvica y transvaginal.  Sospecho que puede tener vaginitis.  Haremos un seguimiento con el plan de tratamiento a medida que los [place] de laboratorio estn disponibles  Le recomiendo que aumente la ingesta de agua a 6 a 8 vasos/da.  Tambin, contine la dieta baja en grasas, bajo en carbohidratos como se discuti.   We will follow-up by phone with any abnormal laboratory results.  For continued chronic pelvic pain, I recommend a pelvic and transvaginal ultrasound.  I suspect that you may have vaginitis.  We will follow-up with treatment plan as laboratory results become available  I recommend that you increase water intake to 6 to 8 glasses/day.  Also, continue low-fat, low carbohydrate diet as discussed.   Disuria (Dysuria) La disuria es dolor o molestia al [employer]. El dolor o la molestia se pueden sentir en el conducto que transporta la orina fuera de la vejiga (uretra) o en el tejido que rodea los genitales. El dolor tambin se puede sentir en la zona de la ingle y en la parte inferior del abdomen y de la espalda. Quizs tenga que orinar con frecuencia o la sensacin repentina de tener que orinar (tenesmo vesical). La disuria puede afectar tanto a hombres como a mujeres, pero es ms comn en las mujeres. La causa puede deberse a muchos problemas diferentes:  Infeccin en las vas urinarias en mujeres.  Infeccin en los riones o la vejiga.  Clculos en los riones o la vejiga.  Ciertas enfermedades de transmisin sexual (ETS), como la clamidia.  Deshidratacin.  Inflamacin de la vagina.  Uso de ciertos medicamentos.  Uso de ciertos jabones o productos perfumados que provocan irritacin. INSTRUCCIONES PARA EL CUIDADO EN EL HOGAR Controle su disuria para ver si hay cambios. Las siguientes indicaciones pueden ayudar a Associate Professor [employer] pueda sentir:  Beba suficiente lquido para Theatre manager la orina clara o de color amarillo plido.  Vace la vejiga con frecuencia. Evite retener la orina durante largos perodos.  Despus de defecar, las mujeres deben limpiarse desde adelante hacia atrs, usando el papel higinico solo [place].  Vace la vejiga despus de Clinical biochemist.  Tome los medicamentos solamente como se lo haya indicado el mdico.  Si le recetaron antibiticos, asegrese de terminarlos, incluso si comienza a sentirse mejor.  Evite la cafena, el t y el alcohol. Estos productos pueden Scientist, research (medical) vejiga y Actuary disuria. En los hombres, el alcohol puede irritar la prstata.  Concurra a todas las visitas de control como se lo haya indicado el mdico. Esto es importante.  Si le realizaron pruebas para Product manager causa de la disuria, es su responsabilidad retirar los [place]. Consulte en el laboratorio o en el departamento en el que fue realizado el estudio cundo y cmo podr [employer]. Hable con el mdico si tiene [employer]. SOLICITE ATENCIN MDICA SI:  Siente dolor en la espalda o a los costados del cuerpo.  Tiene fiebre.  Tiene nuseas o vmitos.  Observa sangre en la orina.  Est orinando con ms frecuencia que lo habitual. SOLICITE ATENCIN MDICA DE INMEDIATO SI:  El dolor es intenso y no se alivia con los medicamentos.  No puede retener lquido.  Usted u otra persona advierten algn cambio en su funcin mental.  Tiene una frecuencia cardaca acelerada en reposo.  Tiene temblores o escalofros.  Se siente  muy dbil. Esta informacin no tiene Marine scientist el consejo del mdico. Asegrese de hacerle al mdico cualquier pregunta que tenga. Document Released: 04/13/2007 Document Revised: 07/16/2015 Document Reviewed: 11/17/2013 Elsevier Interactive Patient Education  [employer].

## 2017-09-18 LAB — BASIC METABOLIC PANEL
BUN/Creatinine Ratio: 21 (ref 12–28)
BUN: 12 mg/dL (ref 8–27)
CALCIUM: 9.7 mg/dL (ref 8.7–10.3)
CO2: 21 mmol/L (ref 20–29)
CREATININE: 0.58 mg/dL (ref 0.57–1.00)
Chloride: 103 mmol/L (ref 96–106)
GFR calc Af Amer: 114 mL/min/{1.73_m2} (ref >59)
GFR calc non Af Amer: 99 mL/min/{1.73_m2} (ref >59)
GLUCOSE: 104 mg/dL — AB (ref 65–99)
Potassium: 4.1 mmol/L (ref 3.5–5.2)
SODIUM: 142 mmol/L (ref 134–144)

## 2017-09-19 LAB — VAGINITIS/VAGINOSIS, DNA PROBE
Candida Species: NEGATIVE
Gardnerella vaginalis: NEGATIVE
TRICHOMONAS VAG: NEGATIVE

## 2017-09-19 LAB — URINE CULTURE

## 2017-09-22 ENCOUNTER — Ambulatory Visit (HOSPITAL_COMMUNITY): Payer: No Typology Code available for payment source

## 2017-11-11 ENCOUNTER — Telehealth: Payer: Self-pay

## 2017-11-11 DIAGNOSIS — I10 Essential (primary) hypertension: Secondary | ICD-10-CM

## 2017-11-11 DIAGNOSIS — R7303 Prediabetes: Secondary | ICD-10-CM

## 2017-11-11 MED ORDER — METFORMIN HCL ER 500 MG PO TB24: 500.0000 mg | ORAL_TABLET | Freq: Every day | ORAL | 5 refills | Status: AC

## 2017-11-11 MED ORDER — AMLODIPINE BESYLATE 5 MG PO TABS
5.0000 mg | ORAL_TABLET | Freq: Every day | ORAL | 1 refills | Status: DC
Start: 2017-11-11 — End: 2018-06-16

## 2017-11-11 NOTE — Telephone Encounter (Signed)
Refills sent into pharmacy. Thanks!  

## 2017-11-20 ENCOUNTER — Ambulatory Visit: Payer: No Typology Code available for payment source | Admitting: Family Medicine

## 2017-12-02 ENCOUNTER — Other Ambulatory Visit: Payer: Self-pay | Admitting: Obstetrics and Gynecology

## 2018-01-13 ENCOUNTER — Ambulatory Visit: Payer: No Typology Code available for payment source | Admitting: Family Medicine

## 2018-06-16 ENCOUNTER — Telehealth: Payer: Self-pay

## 2018-06-16 DIAGNOSIS — I10 Essential (primary) hypertension: Secondary | ICD-10-CM

## 2018-06-16 MED ORDER — AMLODIPINE BESYLATE 5 MG PO TABS: 5.0000 mg | ORAL_TABLET | Freq: Every day | ORAL | 0 refills | Status: AC

## 2018-06-16 NOTE — Telephone Encounter (Signed)
Medication sent for 30 day supply until patient comes in for office visit.

## 2018-06-30 ENCOUNTER — Ambulatory Visit: Payer: No Typology Code available for payment source | Admitting: Family Medicine

## 2018-07-02 IMAGING — US US ABDOMEN COMPLETE
1 series · 14 of 25 positions shown · non-contrast
Comparison: None

CLINICAL DATA: RIGHT-side abdominal pain, RIGHT flank pain,
symptoms for 2 weeks, history hypertension, diabetes mellitus

EXAM:
ABDOMEN ULTRASOUND COMPLETE

[Series 1: us abdomen complete · 0.20mm/px · 14 of 92 slices shown]
[im 1/92]
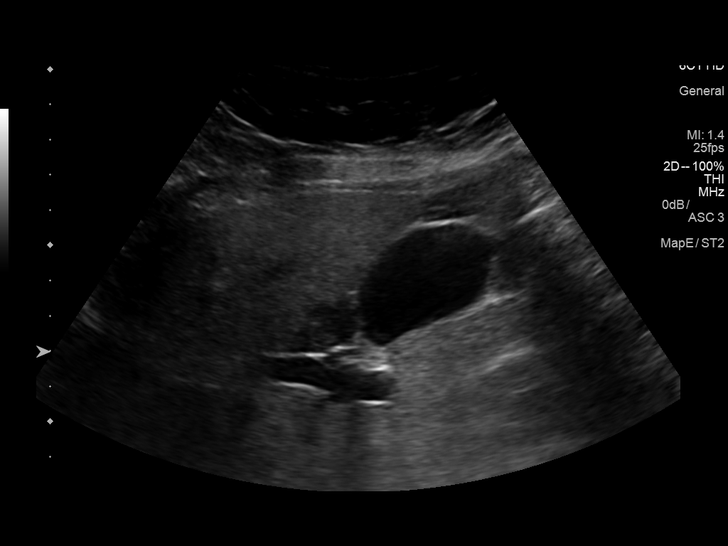
[im 8/92]
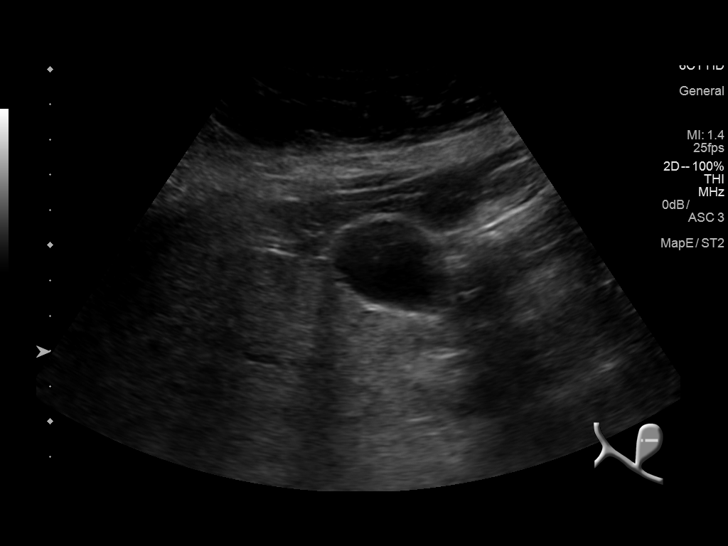
[im 16/92]
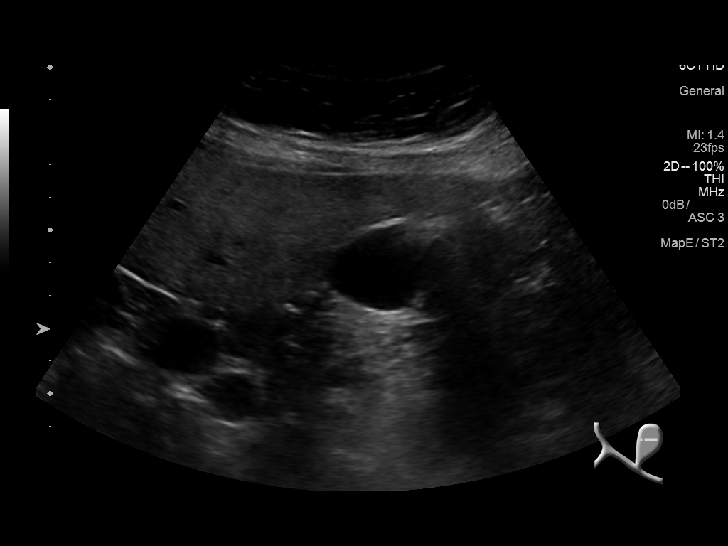
[im 23/92]
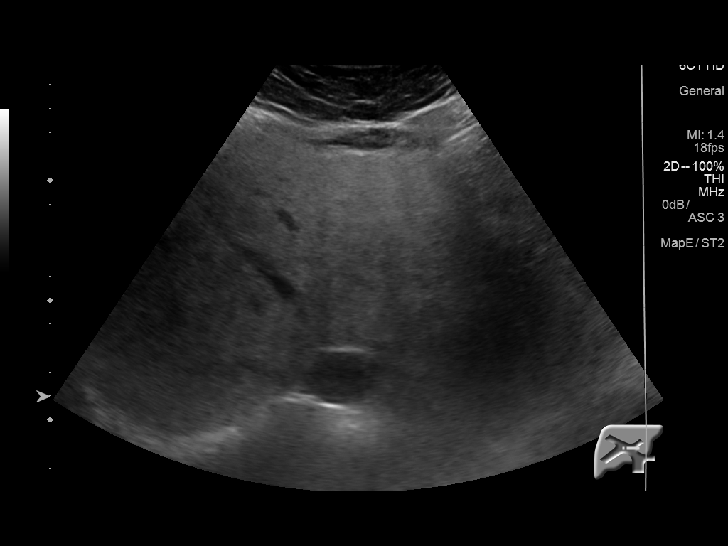
[im 31/92]
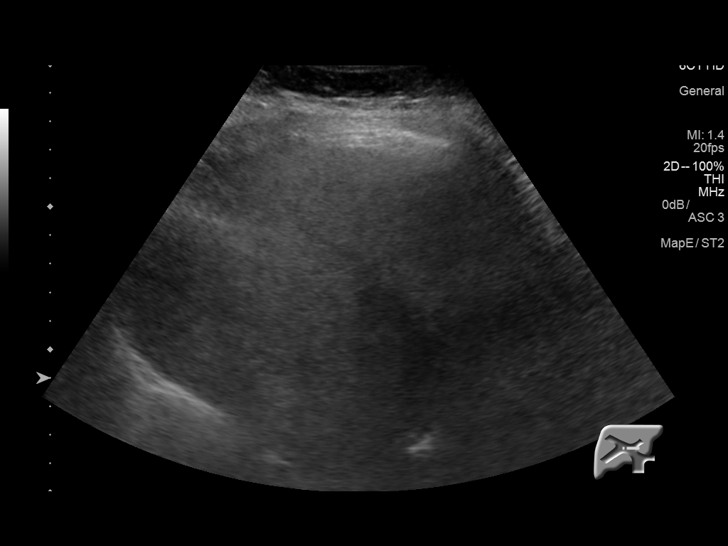
[im 35/92]
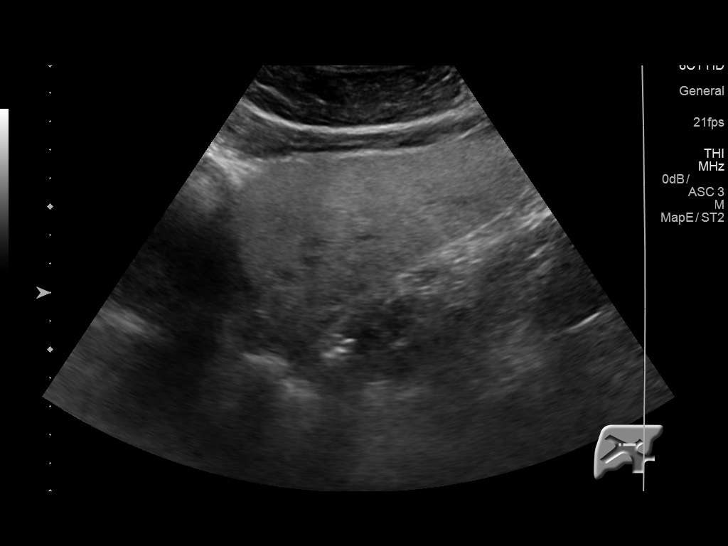
[im 42/92]
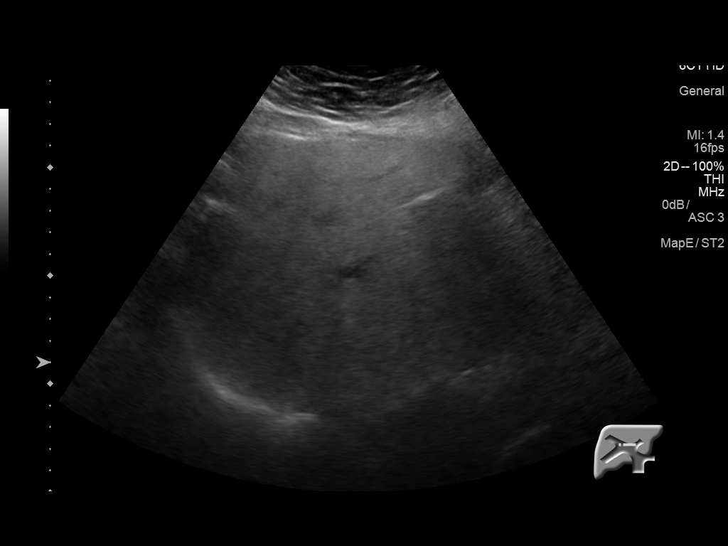
[im 50/92]
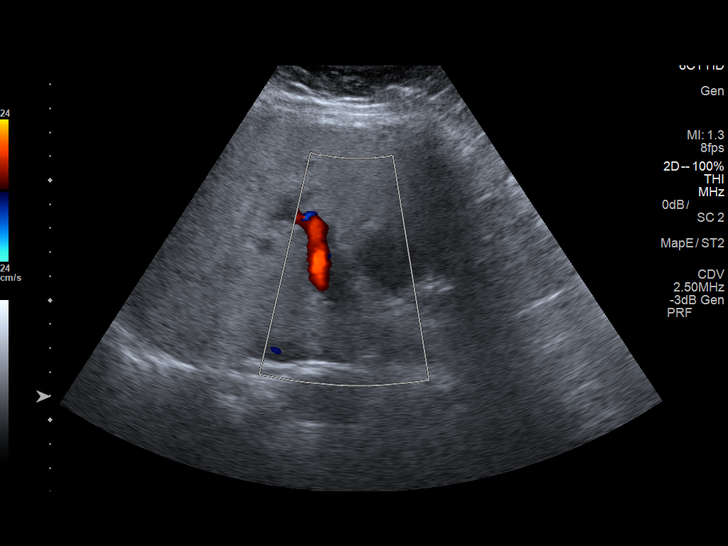
[im 57/92]
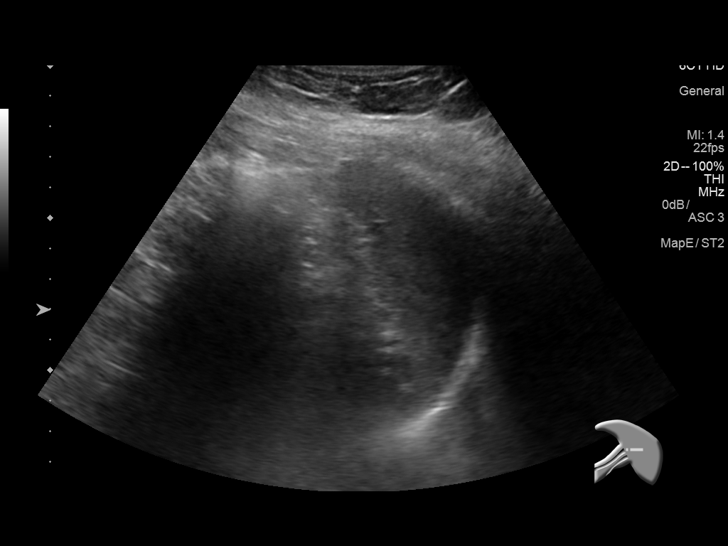
[im 61/92]
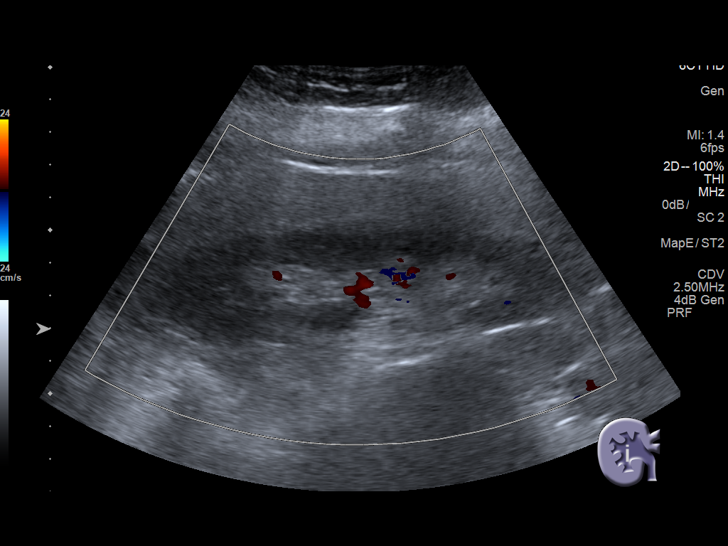
[im 69/92]
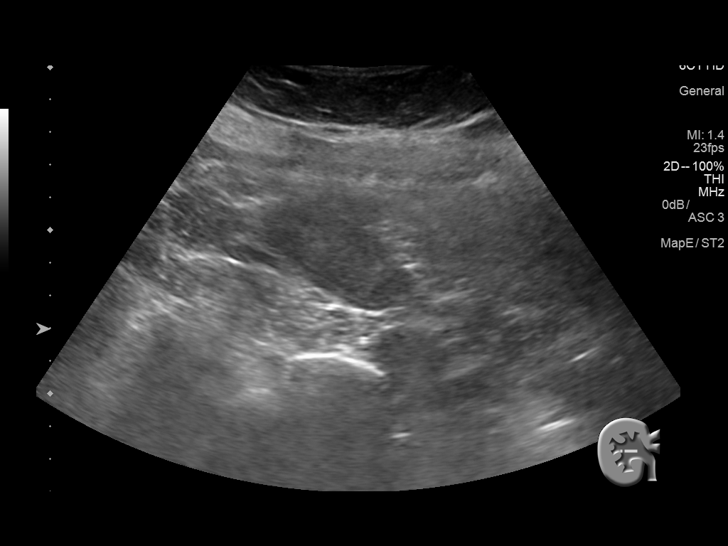
[im 76/92]
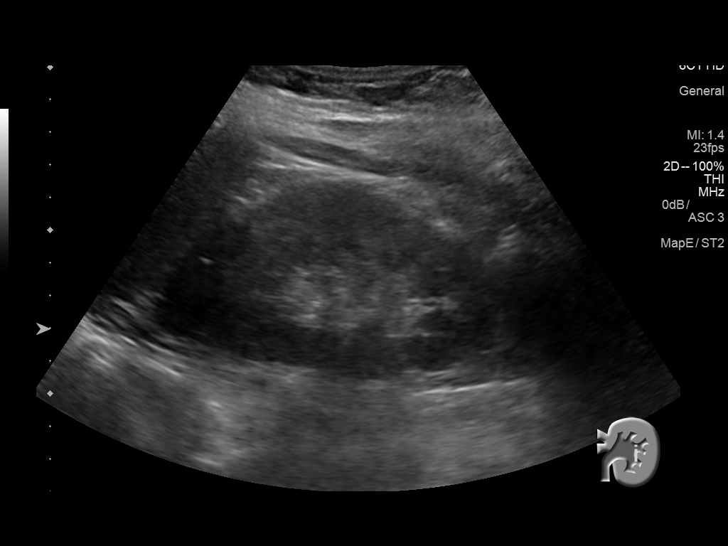
[im 84/92]
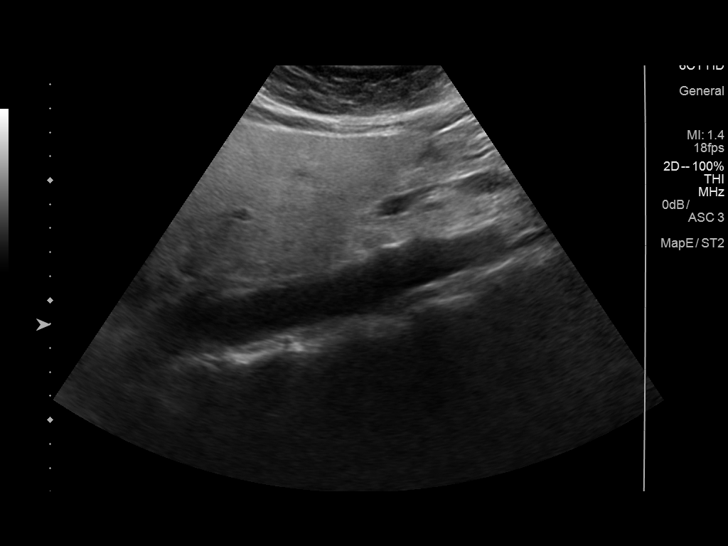
[im 92/92]
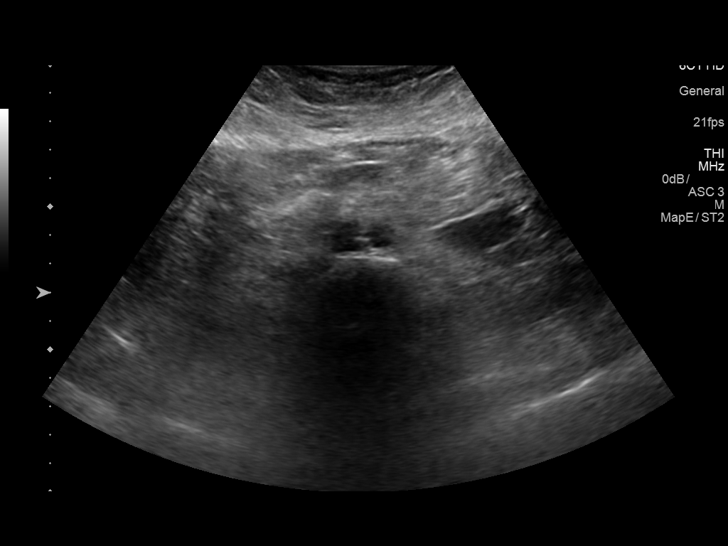

[14 of 25 positions shown; findings below may reference images not displayed]

FINDINGS: Gallbladder: Normally distended without stones or wall thickening.
No pericholecystic fluid or sonographic Murphy sign.

Common bile duct: Diameter: 2 mm diameter, normal

Liver: Echogenic parenchyma, likely fatty infiltration though this
can be seen with cirrhosis and certain infiltrative disorders. No
focal hepatic mass or nodularity. Portal vein is patent on color
Doppler imaging with normal direction of blood flow towards the
liver.

IVC: Normal appearance

Pancreas: Normal appearance

Spleen: Normal appearance, 4.8 cm length

Right Kidney: Length: 11.8 cm. Normal morphology without mass or
hydronephrosis.

Left Kidney: Length: 11.0 cm. Normal morphology without mass or
hydronephrosis.

Abdominal aorta: Normal caliber

Other findings: No free fluid
IMPRESSION: Probable fatty infiltration of liver as above.

Otherwise negative exam.

## 2018-12-15 ENCOUNTER — Encounter (HOSPITAL_COMMUNITY): Payer: Self-pay

## 2018-12-15 ENCOUNTER — Encounter (HOSPITAL_COMMUNITY): Payer: Self-pay | Admitting: *Deleted

## 2022-03-06 ENCOUNTER — Ambulatory Visit: Payer: Self-pay | Admitting: Obstetrics & Gynecology

## 2022-04-09 ENCOUNTER — Ambulatory Visit (INDEPENDENT_AMBULATORY_CARE_PROVIDER_SITE_OTHER): Payer: Medicare Other | Admitting: Obstetrics & Gynecology

## 2022-04-09 ENCOUNTER — Encounter: Payer: Self-pay | Admitting: Obstetrics & Gynecology

## 2022-04-09 VITALS — BP 124/80 | HR 105 | Temp 98.7°F

## 2022-04-09 DIAGNOSIS — Z90710 Acquired absence of both cervix and uterus: Secondary | ICD-10-CM | POA: Diagnosis not present

## 2022-04-09 DIAGNOSIS — Z90722 Acquired absence of ovaries, bilateral: Secondary | ICD-10-CM | POA: Diagnosis not present

## 2022-04-09 DIAGNOSIS — A58 Granuloma inguinale: Secondary | ICD-10-CM

## 2022-04-09 DIAGNOSIS — R102 Pelvic and perineal pain: Secondary | ICD-10-CM | POA: Diagnosis not present

## 2022-04-09 LAB — URINALYSIS, COMPLETE W/RFL CULTURE
Bacteria, UA: NONE SEEN /HPF
Bilirubin Urine: NEGATIVE
Casts: NONE SEEN /LPF
Crystals: NONE SEEN /HPF
Hgb urine dipstick: NEGATIVE
Hyaline Cast: NONE SEEN /LPF
Leukocyte Esterase: NEGATIVE
Nitrites, Initial: NEGATIVE
Protein, ur: NEGATIVE
RBC / HPF: NONE SEEN /HPF (ref 0–2)
Specific Gravity, Urine: 1.015 (ref 1.001–1.035)
WBC, UA: NONE SEEN /HPF (ref 0–5)
Yeast: NONE SEEN /HPF
pH: 7 (ref 5.0–8.0)

## 2022-04-09 LAB — NO CULTURE INDICATED

## 2022-04-09 NOTE — Progress Notes (Signed)
    Laura Richardson [birth year] [id number]        67 y.o.  [id number]   RP: Pelvic Pain improving x Robotic TLH/BSO in 05/2021  HPI: Improved pelvic pain, but still not completely resolved postop. No vaginal d/c or bleeding.  Not sexually active x many years.   S/P Robotic TLH/BSO in 05/2021 by Dr Polly Cobia for Pelvic pain with a cervical mass.  Patho completely benign, cervical fibroid/uterine fibroid, adenomyosis/endometrial polyp.  No UTI Sx.  BMs normal. Up to date with [facility].   OB History  Gravida Para Term Preterm AB Living  '5       3 2  '$ SAB IAB Ectopic Multiple Live Births  3       2    # Outcome Date GA Lbr Len/2nd Weight Sex Delivery Anes PTL Lv  5 Gravida           4 Gravida           3 SAB           2 SAB           1 SAB             Past medical history,surgical history, problem list, medications, allergies, family history and social history were all reviewed and documented in the EPIC chart.   Directed ROS with pertinent positives and negatives documented in the history of present illness/assessment and plan.  Exam:  There were no vitals filed for this visit. General appearance:  Normal  Abdomen: Normal  Gynecologic exam: Vulva normal.  Speculum:  Vaginal cuff with a 2 cm granuloma.  Burnt completely with Silver Nitrate.  Hemostasis adequate.    U/A completely negative   Assessment/Plan:  67 y.o. [id number]   1. Pelvic pain in female Improved pelvic pain, but still not completely resolved postop. No vaginal d/c or bleeding.  Not sexually active x many years.   S/P Robotic TLH/BSO in 05/2021 by Dr Polly Cobia for Pelvic pain with a cervical mass.  Patho completely benign, cervical fibroid/uterine fibroid, adenomyosis/endometrial polyp.  No UTI Sx.  BMs normal. Up to date with [facility].  Possibly d/t vaginal cuff granuloma.  Burnt with Silver Nitrate.  Good hemostasis.  Patient informed and reassured.  Will reassess in 4 weeks. - Urinalysis,Complete w/RFL Culture  2. S/P  total hysterectomy and BSO (bilateral salpingo-oophorectomy) Patho Benign: cervical fibroid/uterine fibroid, adenomyosis/endometrial polyp.  3. Vaginal cuff granuloma Silver Nitrate applied.  Granuloma completely burnt.  Good hemostasis.  Will f/u in 4 weeks to reassess.  Other orders - pantoprazole (PROTONIX) 40 MG tablet; Take 40 mg by mouth daily. - Ascorbic Acid (VITAMIN C) 1000 MG tablet; Take by mouth. - VITAMIN D PO; Take by mouth. - Omega-3 Fatty Acids (OMEGA 3 PO); Take by mouth.    Princess Bruins MD, 11:34 AM 04/09/2022

## 2022-05-07 ENCOUNTER — Encounter: Payer: Self-pay | Admitting: Obstetrics & Gynecology

## 2022-05-07 ENCOUNTER — Ambulatory Visit: Payer: Medicare Other | Admitting: Obstetrics & Gynecology

## 2022-05-07 VITALS — BP 110/80

## 2022-05-07 DIAGNOSIS — A58 Granuloma inguinale: Secondary | ICD-10-CM

## 2022-05-07 DIAGNOSIS — R3915 Urgency of urination: Secondary | ICD-10-CM

## 2022-05-07 NOTE — Progress Notes (Signed)
    Avari Nevares November 16, 1955 [id number]        67 y.o.  [id number]   RP: F/U vaginal vault granulomas post Silver Nitrate  HPI: Vaginal vault granulomas post Silver Nitrate. No vaginal bleeding or discharge.  No pelvic pain.  C/O mild urinary urgency, but no leakage.     OB History  Gravida Para Term Preterm AB Living  '5 2 2   3 1  '$ SAB IAB Ectopic Multiple Live Births  3       2    # Outcome Date GA Lbr Len/2nd Weight Sex Delivery Anes PTL Lv  5 Term           4 Term           3 SAB         FD  2 SAB           1 SAB             Past medical history,surgical history, problem list, medications, allergies, family history and social history were all reviewed and documented in the EPIC chart.   Directed ROS with pertinent positives and negatives documented in the history of present illness/assessment and plan.  Exam:  Vitals:   05/07/22 1014  BP: 110/80  SpO2: 98%   General appearance:  Normal  Abdomen: Normal  Gynecologic exam: Vulva normal.  Speculum:  Vagina normal.  Granulomas completely resolved, vaginal vault intact.  No blood, no discharge.   Assessment/Plan:  67 y.o. [id number]   1. Vaginal cuff granuloma  Vaginal vault granulomas post Silver Nitrate. No vaginal bleeding or discharge.  No pelvic pain.  Vaginal cuff granulomas resolved completely post Silver Nitrate application last visit.  Patient reassured.  2. Urinary urgency  C/O mild urinary urgency, but no leakage.  Counseling done.  Will reduce caffeine intake, empty her bladder more regularly.  Kegels instructed.  Will call back to be referred to Urology if symptoms worsen.  Princess Bruins MD, 10:50 AM 05/07/2022
